# Patient Record
Sex: Male | Born: 1990 | Race: Black or African American | Hispanic: No | Marital: Single | Smoking: Current some day smoker
Health system: Southern US, Community
[De-identification: ages and names within clinical notes are randomized; demographics above are authoritative.]

## PROBLEM LIST (undated history)

## (undated) DIAGNOSIS — F329 Major depressive disorder, single episode, unspecified: Secondary | ICD-10-CM

## (undated) DIAGNOSIS — M1990 Unspecified osteoarthritis, unspecified site: Secondary | ICD-10-CM

## (undated) DIAGNOSIS — K219 Gastro-esophageal reflux disease without esophagitis: Secondary | ICD-10-CM

## (undated) DIAGNOSIS — F4310 Post-traumatic stress disorder, unspecified: Secondary | ICD-10-CM

## (undated) DIAGNOSIS — F32A Depression, unspecified: Secondary | ICD-10-CM

## (undated) DIAGNOSIS — F419 Anxiety disorder, unspecified: Secondary | ICD-10-CM

## (undated) HISTORY — PX: HIP ARTHROSCOPY W/ LABRAL REPAIR: SHX1750

---
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
## 2008-10-25 HISTORY — PX: KNEE ARTHROSCOPY: SUR90

## 2015-08-25 HISTORY — PX: KNEE ARTHROSCOPY: SUR90

## 2018-02-11 ENCOUNTER — Ambulatory Visit: Payer: No Typology Code available for payment source | Admitting: Occupational Therapy

## 2018-02-11 ENCOUNTER — Encounter: Payer: Self-pay | Admitting: Physical Therapy

## 2018-02-11 ENCOUNTER — Ambulatory Visit: Payer: No Typology Code available for payment source | Attending: Orthopedic Surgery | Admitting: Physical Therapy

## 2018-02-11 DIAGNOSIS — M25552 Pain in left hip: Secondary | ICD-10-CM

## 2018-02-11 DIAGNOSIS — M6281 Muscle weakness (generalized): Secondary | ICD-10-CM | POA: Insufficient documentation

## 2018-02-11 DIAGNOSIS — R2689 Other abnormalities of gait and mobility: Secondary | ICD-10-CM | POA: Insufficient documentation

## 2018-02-11 DIAGNOSIS — M25652 Stiffness of left hip, not elsewhere classified: Secondary | ICD-10-CM | POA: Diagnosis present

## 2018-02-11 NOTE — Therapy (Signed)
[facility] [address] [place], [place], [address] Phone: [phone]   Fax:  [phone]  Occupational Therapy Treatment  Patient Details  Name: Luis Arias MRN: [id number] Date of Birth: [birth year] Referring Provider (OT): 12/23/17   Encounter Date: 02/11/2018  OT End of Session - 02/11/18 0906    Visit Number  1    Number of Visits  1    Date for OT Re-Evaluation  --   n/a   Authorization Type  [place]    OT Start Time  0807    OT Stop Time  0840    OT Time Calculation (min)  33 min    Activity Tolerance  Patient tolerated treatment well    Behavior During Therapy  Restless;Anxious       No past medical history on file.  No past surgical history on file.  There were no vitals filed for this visit.  Subjective Assessment - 02/11/18 0813    Subjective   Pt reports pain and weakness s/p surgery to repair a torn hip labrum    Patient Stated Goals  to regain strength and independence    Currently in Pain?  Yes    Pain Score  5     Pain Location  Hip    Pain Orientation  Left    Pain Descriptors / Indicators  Aching    Pain Type  Acute pain    Pain Onset  More than a month ago    Pain Frequency  Intermittent    Aggravating Factors   malpositioning    Pain Relieving Factors  repositioning         [facility] OT Assessment - 02/11/18 0815      Assessment   Medical Diagnosis  left hip labrum surgery 12/23/17   Referring Provider (OT) Stormy Cardobert Creighton MD     Precautions   Precautions  Other (comment)    Precaution Comments  light activity, no quick changes of positions, no squatting      Balance Screen   Has the patient fallen in the past 6 months  No    Has the patient had a decrease in activity level because of a fear of falling?   No    Is the patient reluctant to leave their home because of a fear of falling?   No      Home  Environment   Family/patient expects to be discharged to:  Private  residence    [employer]ower/Tub  Tub/Shower unit    Lives With  Alone      Prior Function   Level of Independence  Independent    Vocation  On disability   from [employer]   Leisure  workout      ADL   Eating/Feeding  Independent    Grooming  Independent    Upper Body Bathing  Modified independent    Lower Body Bathing  Modified independent    Upper Body Dressing  Independent    Lower Body Dressing  Modified independent    Toilet Transfer  Modified independent    Tub/Shower Transfer  Modified independent      IADL   Shopping  Takes care of all shopping needs independently    Light Housekeeping  Maintains house alone or with occasional assistance    Meal Prep  Able to complete simple warm meal prep    Medication Management  Is responsible for taking medication in correct dosages at correct time  Mobility   Mobility Status  Independent      Cognition   Overall Cognitive Status  Within Functional Limits for tasks assessed    Behaviors  Restless      Coordination   Gross Motor Movements are Fluid and Coordinated  Yes      ROM / Strength   AROM / PROM / Strength  AROM;Strength      AROM   Overall AROM   Within functional limits for tasks performed      Strength   Overall Strength  Within functional limits for tasks performed    Overall Strength Comments  Bilateral UE strength 5/5                                   Plan - 02/11/18 0852    Clinical Impression Statement  Pt is a 27 y.o s/p left hip labrum surgery 12/23/17 who presents with the following deficits: hip pain, decreased strength and endurance which impede performance of daily activities. Pt reports he is independent with all basic ADLs and home management and therefore his needs can be addressed through PT. OT will sign off at this time.    Occupational Profile and client history currently impacting functional performance  PMH: knee surgery 2016, hip injury 2017, left meniscus  repair july 2017, left hip labrum repair 12/23/17. Pt is on disability, he was previously in [employer] until 2017    Occupational performance deficits (Please refer to evaluation for details):  ADL's;IADL's;Leisure    Rehab Potential  Good    OT Frequency  One time visit    OT Duration  8 weeks    OT Treatment/Interventions  Self-care/ADL training    Plan  Pt deficits can be addressed through PT, he does not require OT services at this time. No further OT visits needed at this time.,   Clinical Decision Making  Limited treatment options, no task modification necessary    Consulted and Agree with Plan of Care  Patient       Patient will benefit from skilled therapeutic intervention in order to improve the following deficits and impairments:  Pain, Decreased mobility, Decreased endurance, Difficulty walking, Impaired perceived functional ability  Visit Diagnosis: Pain in left hip - Plan: Ot plan of care cert/re-cert    Problem List There are no active problems to display for this patient.   RINE,KATHRYN 02/11/2018, 9:07 AM  [facility] [address] [place], [place], [address] Phone: [phone]   Fax:  [phone]  Name: Luis Arias MRN: [id number] Date of Birth: [birth year]

## 2018-02-11 NOTE — Therapy (Signed)
[facility] [address] [place], [place], [address] Phone: [phone]   Fax:  [phone]  Physical Therapy Evaluation  Patient Details  Name: Luis Arias MRN: [id number] Date of Birth: March 18, 1990 Referring Provider (PT): Stormy Card, MD   Encounter Date: 02/11/2018  PT End of Session - 02/11/18 1201    Visit Number  1    Number of Visits  15    Date for PT Re-Evaluation  05/12/18    Authorization Type  [facility]     Authorization Time Period  11/10/17-08/10/18    Authorization - Visit Number  1    Authorization - Number of Visits  15    PT Start Time  0845    PT Stop Time  0941    PT Time Calculation (min)  56 min    Activity Tolerance  Patient tolerated treatment well    Behavior During Therapy  Anxious;Restless       History reviewed. No pertinent past medical history.  History reviewed. No pertinent surgical history.  There were no vitals filed for this visit.   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  Had hip pain and torn hip labrum on L for several years prior to surgery.  Have had a hard time getting to the surgery and getting to therapy after the surgery.  I've been working on stretching and stregnthening at the gym.    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  To get to the point to where I can participate in sports.    Currently in Pain?  Yes    Pain Score  5     Pain Location  Hip    Pain Orientation  Left    Pain Descriptors / Indicators  Aching    Pain Type  Acute pain    Pain Onset  More than a month ago    Pain Frequency  Intermittent    Aggravating Factors   malpositioning    Pain Relieving Factors  repositioning          [facility] PT Assessment - 02/11/18 0901      Assessment   Medical Diagnosis  left hip labrum tear, s/p arthroscopic surgery    Referring Provider (PT)  Stormy Card, MD    Onset Date/Surgical Date  12/23/17   arthoscopic hip surgery     Precautions   Precautions  Other (comment)    Precaution Comments  light activity, no quick changes of positions, no squatting   per patient report     Balance Screen   Has the patient fallen in the past 6 months  No    Has the patient had a decrease in activity level because of a fear of falling?   No    Is the patient reluctant to leave their home because of a fear of falling?   No      Prior Function   Level of Independence  Independent    Vocation  On disability   from [employer]   Leisure  Enjoys working out and sports      Observation/Other Assessments   Observations  Palpation to L iliotibial band:  pt tender to palpation with trigger point areas noted  along mid-inferior aspect of IT band    Focus on Therapeutic Outcomes (FOTO)   NA      Sensation   Additional Comments  Pt reports numbness along mid-lateral thigh on L      Posture/Postural Control   Posture/Postural Control  Postural limitations    Postural Limitations  Posterior pelvic tilt;Rounded Shoulders   In sitting position; pt able to correct to neutral   Posture Comments  Tends to hold lower extremities in externally rotated hips, L > R      ROM / Strength   AROM / PROM / Strength  AROM;Strength;PROM      AROM   Overall AROM   Deficits    AROM Assessment Site  Hip;Knee    Right/Left Hip  Right;Left    Right Hip Extension  25    Right Hip Flexion  90    Left Hip Extension  12    Left Hip ABduction  --   >30 degrees with pain -tends to externally rotate hip   Right/Left Knee  Right;Left    Right Knee Flexion  125    Left Knee Flexion  110      PROM   PROM Assessment Site  Hip;Knee    Right/Left Hip  Right;Left    Left Hip External Rotation   45   end range  pain   Left Hip Internal Rotation   20    Right/Left Knee  Right;Left    Right Knee Extension  --   lacks 18 degrees full extension   Left Knee Extension  --   lacks 12 degrees full extension     Strength   Overall Strength  Deficits    Strength Assessment Site  Hip;Knee;Ankle    Right/Left Hip  Right;Left    Right Hip Flexion  5/5    Left Hip Flexion  4/5      Ambulation/Gait   Ambulation/Gait  Yes    Ambulation/Gait Assistance  7: Independent    Ambulation Distance (Feet)  100 Feet    Assistive device  None    Gait Pattern  Step-through pattern;Trendelenburg;Lateral hip instability    Ambulation Surface  Level;Indoor    Gait velocity  7.84 sec = 4.18 ft/sec      High Level Balance   High Level Balance Comments  Single limb stance:  Pt able to maintain SLS R and LLE 30 seconds; on LLE, increased trunk sway      MMT continued: R quads, R hamstrings 5/5 L quads 4/5, L hamstrings 4/5 R and L hip extension 3-/5         Objective measurements completed on examination: See above findings.              PT Education - 02/11/18 1200    Education Details  Results of PT evaluation, POC    Person(s) Educated  Patient    Methods  Explanation    Comprehension  Verbalized understanding       PT Short Term Goals - 02/11/18 1211      PT SHORT TERM GOAL #1   Title  Pt will be independent with HEP for improved strength, flexibility, and decreased pain.  TARGET 03/12/18    Time  4    Period  Weeks    Status  New    Target Date  03/12/18      PT SHORT TERM GOAL #2   Title  Pt will demonstrate improved hip internal rotation and hip extension by  5 degrees on L, for improved hip flexibility.    Time  4    Period  Weeks    Status  New    Target Date  03/12/18      PT SHORT TERM GOAL #3   Title  Pt will verbalize/demonstrate proper body mechanics and ADLs in functional activities for decreased pain and improved function.    Time  4    Period  Weeks    Status   New    Target Date  03/12/18        PT Long Term Goals - 02/11/18 1214      PT LONG TERM GOAL #1   Title  Pt will be independent with progressive HEP for improved flexibility, strength, and decreased pain.  TARGET 04/02/18    Time  7    Period  Weeks    Status  New    Target Date  04/02/18      PT LONG TERM GOAL #2   Title  FGA to be assessed, with goal to be written as appropriate to address high level balance and gait activities.    Time  7    Period  Weeks    Status  New    Target Date  04/02/18      PT LONG TERM GOAL #3   Title  Pt will verbalize at least 3 means to decrease pain with functional activities including exercise and gait.    Time  7    Period  Weeks    Status  New    Target Date  04/02/18      PT LONG TERM GOAL #4   Title  Pt will demonstrate improved hip extensor strength to at least 4/5 for decreased Trendelenburg gait pattern, improved stability with gait.    Time  7    Period  Weeks    Status  New    Target Date  04/02/18             Plan - 02/11/18 1202    Clinical Impression Statement  Pt is a 27 year old male who presents to OPPT with history of arthoscopic surgery for L hip labrum tear on 12/23/17.  He presents to OPPT with pain, decreased flexibility, decreased strength,  activity limitiations in return to fitness, sports activities.  Pt is interested in and already participating in exercise activities and appears to have increased flexibility in hip abduction, with limitations in hip adductionstrength and internal rotation motion.  He would beneift from exercises to address muscle imbalance.  He would benefit from skilled PT to address the above stated deficits to decrease pain, improve functional mobility.    History and Personal Factors relevant to plan of care:  PMH includes surgery to repair labrum tear L hip; reported history of R and L meniscal tears post surgery; reported back pain    Clinical Presentation  Stable    Clinical  Presentation due to:  pain, strength, flexibility limitations for functional mobility    Clinical Decision Making  Low    Rehab Potential  Good    PT Frequency  2x / week    PT Duration  Other (comment)   7 weeks plus eval   PT Treatment/Interventions  ADLs/Self Care Home Management;Gait training;Functional mobility training;Therapeutic activities;Therapeutic exercise;Patient/family education;Neuromuscular re-education;Balance training;Manual techniques    PT Next Visit Plan  Check FGA and EO/EC on foam for balance; initiate HEP to address hip adduction/extension strength, low back/core stability, hip internal rotation  stretching    Consulted and Agree with Plan of Care  Patient       Patient will benefit from skilled therapeutic intervention in order to improve the following deficits and impairments:  Abnormal gait, Decreased mobility, Decreased range of motion, Decreased strength, Difficulty walking, Impaired flexibility, Postural dysfunction, Impaired sensation  Visit Diagnosis: Pain in left hip  Muscle weakness (generalized)  Stiffness of left hip, not elsewhere classified  Other abnormalities of gait and mobility     Problem List There are no active problems to display for this patient.   Kathalina Ostermann W. 02/11/2018, 12:18 PM Gean MaidensMARRIOTT,Montserrath Madding W., PT  [facility] [address] [place], [place], [address] Phone: [phone]   Fax:  [phone]  Name: Angelita InglesKennon Shafer MRN: [id number] Date of Birth: Feb 06, 1991

## 2018-02-15 ENCOUNTER — Encounter: Payer: Self-pay | Admitting: Physical Therapy

## 2018-02-15 ENCOUNTER — Ambulatory Visit: Payer: No Typology Code available for payment source | Admitting: Physical Therapy

## 2018-02-15 DIAGNOSIS — M25552 Pain in left hip: Secondary | ICD-10-CM | POA: Diagnosis not present

## 2018-02-15 DIAGNOSIS — M25652 Stiffness of left hip, not elsewhere classified: Secondary | ICD-10-CM

## 2018-02-15 DIAGNOSIS — M6281 Muscle weakness (generalized): Secondary | ICD-10-CM

## 2018-02-15 NOTE — Patient Instructions (Signed)
MedBridge created and printed (did not get code)  -bilateral bridging with ball squeezes -single limb bridging -hip flexor stretch, off edge of mat, 3 reps 30 seconds -L hip abduction x 10 sidelying -L hip extension x 10 prone -L hip adduction x 10 sidelying  To be performed 2 sets x 10, 5x per week

## 2018-02-15 NOTE — Therapy (Signed)
[facility] [address] [place], [place], [address] Phone: [phone]   Fax:  [phone]  Physical Therapy Treatment  Patient Details  Name: Luis InglesKennon Arias MRN: [id number] Date of Birth: [birth year] Referring Provider (PT): Stormy Cardobert Creighton, MD   Encounter Date: 02/15/2018  PT End of Session - 02/15/18 1512    Visit Number  2    Number of Visits  15    Date for PT Re-Evaluation  05/12/18    Authorization Type  [facility]     Authorization Time Period  11/10/17-08/10/18    Authorization - Visit Number  2    Authorization - Number of Visits  15    PT Start Time  0936    PT Stop Time  1018    PT Time Calculation (min)  42 min    Equipment Utilized During Treatment  Gait belt    Activity Tolerance  Patient tolerated treatment well    Behavior During Therapy  [facility] for tasks assessed/performed       History reviewed. No pertinent past medical history.  History reviewed. No pertinent surgical history.  There were no vitals filed for this visit.  Subjective Assessment - 02/15/18 0937    Subjective  Having still a little numbness and pain as normal.  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  To get to the point to where I can participate in sports.    Currently in Pain?  Yes    Pain Score  5     Pain Location  Hip    Pain Orientation  Left    Pain Descriptors / Indicators  Aching    Pain Onset  More than a month ago    Pain Frequency  Intermittent    Aggravating Factors   malpositioning    Pain Relieving Factors  repositioning                       [facility] Adult PT Treatment/Exercise - 02/15/18 0947      Exercises   Exercises  Knee/Hip;Other Exercises    Other  Exercises   Seated lumbar stabilization-anterior/posterior pelvic tilt with cues for abdominal activation in neutral.      Knee/Hip Exercises: Stretches   Hip Flexor Stretch  Left;1 rep;30 seconds   supine off edge of the mat   Other Knee/Hip Stretches  knee to chest stretch, 2 reps LLE x 10 seconds, RLE x 10 seconds 1 rep      Knee/Hip Exercises: Aerobic   Stationary Bike  foot pedaler x 5 minutes, forward, attention to neutral knee positioning      Knee/Hip Exercises: Standing   Wall Squat  1 set;10 reps    Wall Squat Limitations  Cues for abdominal activation       Knee/Hip Exercises: Supine   Bridges  Strengthening;Both;1 set;10 reps    Bridges with Harley-DavidsonBall Squeeze  Strengthening;Both;1 set;10 reps    Single Leg Bridge  Strengthening;Left;1 set;10 reps;Right      Knee/Hip Exercises: Sidelying   Hip ABduction  Strengthening;Left;1 set;10 reps    Hip ABduction Limitations  Cues for positioning in neutral with hip position    Hip ADduction  Left;1 set;10 reps  Knee/Hip Exercises: Prone   Hip Extension  Strengthening;Left;1 set;10 reps             PT Education - 02/15/18 1511    Education Details  HEP, attention to neutral spine and neutral hip positioning    Person(s) Educated  Patient    Methods  Explanation;Demonstration;Handout    Comprehension  Verbalized understanding;Returned demonstration;Verbal cues required       PT Short Term Goals - 02/11/18 1211      PT SHORT TERM GOAL #1   Title  Pt will be independent with HEP for improved strength, flexibility, and decreased pain.  TARGET 03/12/18    Time  4    Period  Weeks    Status  New    Target Date  03/12/18      PT SHORT TERM GOAL #2   Title  Pt will demonstrate improved hip internal rotation and hip extension by 5 degrees on L, for improved hip flexibility.    Time  4    Period  Weeks    Status  New    Target Date  03/12/18      PT SHORT TERM GOAL #3   Title  Pt will verbalize/demonstrate proper body  mechanics and ADLs in functional activities for decreased pain and improved function.    Time  4    Period  Weeks    Status  New    Target Date  03/12/18        PT Long Term Goals - 02/11/18 1214      PT LONG TERM GOAL #1   Title  Pt will be independent with progressive HEP for improved flexibility, strength, and decreased pain.  TARGET 04/02/18    Time  7    Period  Weeks    Status  New    Target Date  04/02/18      PT LONG TERM GOAL #2   Title  FGA to be assessed, with goal to be written as appropriate to address high level balance and gait activities.    Time  7    Period  Weeks    Status  New    Target Date  04/02/18      PT LONG TERM GOAL #3   Title  Pt will verbalize at least 3 means to decrease pain with functional activities including exercise and gait.    Time  7    Period  Weeks    Status  New    Target Date  04/02/18      PT LONG TERM GOAL #4   Title  Pt will demonstrate improved hip extensor strength to at least 4/5 for decreased Trendelenburg gait pattern, improved stability with gait.    Time  7    Period  Weeks    Status  New    Target Date  04/02/18            Plan - 02/15/18 1513    Clinical Impression Statement  HEP more formally initiated this visit, to address hip stretching and hip strengthening, particularly of hip extensors and hip adductors.  Pt is motivated to continue therapy for strength and flexibility; at end of session, he rates lower pain to 4/10.  He will continue to benefit from skilled PT to      Rehab Potential  Good    PT Frequency  2x / week    PT Duration  Other (comment)   7 weeks plus eval   PT  Treatment/Interventions  ADLs/Self Care Home Management;Gait training;Functional mobility training;Therapeutic activities;Therapeutic exercise;Patient/family education;Neuromuscular re-education;Balance training;Manual techniques    PT Next Visit Plan  Check FGA and EO/EC on foam for balance; review HEP to address hip  adduction/extension strength, low back/core stability, hip internal rotation stretching    Consulted and Agree with Plan of Care  Patient       Patient will benefit from skilled therapeutic intervention in order to improve the following deficits and impairments:  Abnormal gait, Decreased mobility, Decreased range of motion, Decreased strength, Difficulty walking, Impaired flexibility, Postural dysfunction, Impaired sensation  Visit Diagnosis: Muscle weakness (generalized)  Stiffness of left hip, not elsewhere classified  Pain in left hip     Problem List There are no active problems to display for this patient.   Anouk Critzer W. 02/15/2018, 3:19 PM  Gean Maidens., PT   [facility] [address] [place], [place], [address] Phone: [phone]   Fax:  [phone]  Name: Luis Arias MRN: [id number] Date of Birth: Dec 13, 1990

## 2018-02-22 ENCOUNTER — Encounter: Payer: Self-pay | Admitting: Rehabilitation

## 2018-02-22 ENCOUNTER — Ambulatory Visit: Payer: No Typology Code available for payment source | Admitting: Rehabilitation

## 2018-02-22 DIAGNOSIS — M25652 Stiffness of left hip, not elsewhere classified: Secondary | ICD-10-CM

## 2018-02-22 DIAGNOSIS — M25552 Pain in left hip: Secondary | ICD-10-CM

## 2018-02-22 DIAGNOSIS — M6281 Muscle weakness (generalized): Secondary | ICD-10-CM

## 2018-02-22 DIAGNOSIS — R2689 Other abnormalities of gait and mobility: Secondary | ICD-10-CM

## 2018-02-22 NOTE — Patient Instructions (Signed)
Access Code: [email or web address]  URL: [email or web address]  Date: 02/22/2018  Prepared by: Harriet ButteEmily Tynika Luddy   Exercises  Supine ITB Stretch with Strap - 3 reps - 1 sets - 30-45 hold - 2-3x daily - 7x weekly  Romberg Stance Eyes Closed on Foam Pad - 10 reps - 1 sets - 1x daily - 7x weekly  Single Leg Stance on Foam Pad - 3 reps - 1 sets - 10-15 hold - 1x daily - 7x weekly  Supine Butterfly Groin Stretch - 3 reps - 1 sets - 30-45 hold - 2-3x daily - 7x weekly

## 2018-02-22 NOTE — Therapy (Signed)
[facility] [address] [place], [place], [address] Phone: [phone]   Fax:  [phone]  Physical Therapy Treatment  Patient Details  Name: Luis Arias MRN: [id number] Date of Birth: 1990/11/19 Referring Provider (PT): Stormy Card, MD   Encounter Date: 02/22/2018  PT End of Session - 02/22/18 1408    Visit Number  3    Number of Visits  15    Date for PT Re-Evaluation  05/12/18    Authorization Type  [facility]     Authorization Time Period  11/10/17-08/10/18    Authorization - Visit Number  3    Authorization - Number of Visits  15    PT Start Time  1153   pt late to session   PT Stop Time  1230    PT Time Calculation (min)  37 min    Equipment Utilized During Treatment  Gait belt    Activity Tolerance  Patient tolerated treatment well    Behavior During Therapy  [facility] for tasks assessed/performed       History reviewed. No pertinent past medical history.  History reviewed. No pertinent surgical history.  There were no vitals filed for this visit.  Subjective Assessment - 02/22/18 1153    Subjective  Pt reports still having about the same amount of pain.  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  To get to the point to where I can participate in sports.    Currently in Pain?  Yes    Pain Score  5     Pain Location  Hip    Pain Orientation  Left    Pain Descriptors / Indicators  Aching;Numbness    Pain Type  Acute pain    Pain Onset  More than a month ago    Pain Frequency  Intermittent    Aggravating Factors   malpositioning    Pain Relieving Factors  repositioniong         [facility] PT Assessment - 02/22/18 1208      Functional Gait  Assessment   Gait assessed   Yes     Gait Level Surface  Walks 20 ft in less than 5.5 sec, no assistive devices, good speed, no evidence for imbalance, normal gait pattern, deviates no more than 6 in outside of the 12 in walkway width.    Change in Gait Speed  Able to smoothly change walking speed without loss of balance or gait deviation. Deviate no more than 6 in outside of the 12 in walkway width.    Gait with Horizontal Head Turns  Performs head turns smoothly with no change in gait. Deviates no more than 6 in outside 12 in walkway width    Gait with Vertical Head Turns  Performs head turns with no change in gait. Deviates no more than 6 in outside 12 in walkway width.    Gait and Pivot Turn  Pivot turns safely within 3 sec and stops quickly with no loss of balance.    Step Over Obstacle  Is able to step over 2 stacked shoe boxes taped together (9 in total height) without changing gait speed. No evidence of imbalance.    Gait with Narrow Base of Support  Is able to ambulate for 10 steps heel to toe with no staggering.    Gait with Eyes Closed  Walks 20 ft, uses assistive device, slower speed, mild gait deviations, deviates 6-10 in outside 12 in walkway width. Ambulates 20 ft in less than 9 sec but greater than 7 sec.    Ambulating Backwards  Walks 20 ft, uses assistive device, slower speed, mild gait deviations, deviates 6-10 in outside 12 in walkway width.    Steps  Alternating feet, no rail.    Total Score  28                   [facility] Adult PT Treatment/Exercise - 02/22/18 1208      High Level Balance   High Level Balance Comments  Standing on foam airex in corner performing L SLS x 2 sets of 20 secs with cues for improved L hip stabilization, feet together EC x 2 sets of 20 secs, feet together EC performing head turns x 10 reps and head nods x 10 reps.  Added last to HEP.        Exercises   Exercises  Other Exercises    Other Exercises   Supine hip flexor stretch as in HEP prior to FGA assessment x 2 min on  each side.  Supine L ITB stretch with strap x 2 sets of 45 secs-added to HEP, Performed plank on elbows x 15 secs then attempting hip flex/abd/ER (fire hydrant) in plank, however this caused increased pain.  Pt able to do with lesser ROM, however due to weaker core, began to arch low back, therfore had pt perform in quadruped x 10 reps with marked improvement in technique and pain.   Supine single leg butterfly stretch to LLE with cues for technique and not going to point of pain x 2 reps of 30 secs.             PT Education - 02/22/18 1408    Education Details  additions to HEP for LE flexibility/balance     Person(s) Educated  Patient    Methods  Explanation;Demonstration;Handout    Comprehension  Verbalized understanding;Returned demonstration       PT Short Term Goals - 02/11/18 1211      PT SHORT TERM GOAL #1   Title  Pt will be independent with HEP for improved strength, flexibility, and decreased pain.  TARGET 03/12/18    Time  4    Period  Weeks    Status  New    Target Date  03/12/18      PT SHORT TERM GOAL #2   Title  Pt will demonstrate improved hip internal rotation and hip extension by 5 degrees on L, for improved hip flexibility.    Time  4    Period  Weeks    Status  New    Target Date  03/12/18      PT SHORT TERM GOAL #3   Title  Pt will verbalize/demonstrate proper body mechanics and ADLs in functional activities for decreased pain and improved function.    Time  4    Period  Weeks    Status  New    Target Date  03/12/18        PT Long Term Goals - 02/11/18 1214      PT LONG TERM GOAL #1   Title  Pt will be independent with progressive HEP for improved flexibility, strength, and decreased pain.  TARGET 04/02/18  Time  7    Period  Weeks    Status  New    Target Date  04/02/18      PT LONG TERM GOAL #2   Title  FGA to be assessed, with goal to be written as appropriate to address high level balance and gait activities.    Time  7    Period  Weeks     Status  New    Target Date  04/02/18      PT LONG TERM GOAL #3   Title  Pt will verbalize at least 3 means to decrease pain with functional activities including exercise and gait.    Time  7    Period  Weeks    Status  New    Target Date  04/02/18      PT LONG TERM GOAL #4   Title  Pt will demonstrate improved hip extensor strength to at least 4/5 for decreased Trendelenburg gait pattern, improved stability with gait.    Time  7    Period  Weeks    Status  New    Target Date  04/02/18            Plan - 02/22/18 1409    Clinical Impression Statement  Pt continues to be very motivated to progress with therapy.  Focused on assessment of FGA with score of 28/30, however did have some instability and postural sway on compliant surfaces with EC and feet together.  Added to HEP as well as ITB stretch.      Rehab Potential  Good    PT Frequency  2x / week    PT Duration  Other (comment)   7 weeks plus eval   PT Treatment/Interventions  ADLs/Self Care Home Management;Gait training;Functional mobility training;Therapeutic activities;Therapeutic exercise;Patient/family education;Neuromuscular re-education;Balance training;Manual techniques    PT Next Visit Plan  review HEP to address hip adduction/extension strength, low back/core stability, hip internal rotation stretching, L SLS, compliant surfaces    Consulted and Agree with Plan of Care  Patient       Patient will benefit from skilled therapeutic intervention in order to improve the following deficits and impairments:  Abnormal gait, Decreased mobility, Decreased range of motion, Decreased strength, Difficulty walking, Impaired flexibility, Postural dysfunction, Impaired sensation  Visit Diagnosis: Muscle weakness (generalized)  Stiffness of left hip, not elsewhere classified  Pain in left hip  Other abnormalities of gait and mobility     Problem List There are no active problems to display for this patient.   Harriet ButteEmily  Timara Loma, PT, MPT [facility] [address] [place], [place], [address] Phone: [phone]   Fax:  [phone] 02/22/18, 2:14 PM  Name: Luis Arias MRN: [id number] Date of Birth: 04-11-1990

## 2018-02-26 ENCOUNTER — Encounter: Payer: Self-pay | Admitting: Physical Therapy

## 2018-02-26 ENCOUNTER — Ambulatory Visit: Payer: No Typology Code available for payment source | Attending: Orthopedic Surgery | Admitting: Physical Therapy

## 2018-02-26 DIAGNOSIS — M25652 Stiffness of left hip, not elsewhere classified: Secondary | ICD-10-CM | POA: Diagnosis present

## 2018-02-26 DIAGNOSIS — R2689 Other abnormalities of gait and mobility: Secondary | ICD-10-CM | POA: Insufficient documentation

## 2018-02-26 DIAGNOSIS — M25552 Pain in left hip: Secondary | ICD-10-CM | POA: Insufficient documentation

## 2018-02-26 DIAGNOSIS — M6281 Muscle weakness (generalized): Secondary | ICD-10-CM | POA: Diagnosis present

## 2018-02-26 NOTE — Patient Instructions (Signed)
Access Code: [email or web address]  URL: [email or web address]  Date: 02/26/2018  Prepared by: Lonia Blood   Exercises  Supine ITB Stretch with Strap - 3 reps - 1 sets - 30-45 hold - 2-3x daily - 7x weekly  Romberg Stance Eyes Closed on Foam Pad - 10 reps - 1 sets - 1x daily - 7x weekly  Single Leg Stance on Foam Pad - 3 reps - 1 sets - 10-15 hold - 1x daily - 7x weekly  Supine Butterfly Groin Stretch - 3 reps - 1 sets - 30-45 hold - 2-3x daily - 7x weekly   Added 02/26/2018 Standing ITB Stretch - 3 reps - 1 sets - 15-30 sec hold - 1x daily - 7x weekly  Supine Figure 4 Piriformis Stretch - 3 reps - 1 sets - 15-30 sec hold - 1x daily - 7x weekly  Standing Hip Flexor Stretch - 3 reps - 1 sets - 15-30 sec hold - 1x daily - 7x weekly

## 2018-02-26 NOTE — Therapy (Signed)
[facility] [address] [place], [place], [address] Phone: [phone]   Fax:  [phone]  Physical Therapy Treatment  Patient Details  Name: Luis Arias MRN: [id number] Date of Birth: 02-02-1991 Referring Provider (PT): Stormy Card, MD   Encounter Date: 02/26/2018  PT End of Session - 02/26/18 1311    Visit Number  4    Number of Visits  15    Date for PT Re-Evaluation  05/12/18    Authorization Type  [facility]     Authorization Time Period  11/10/17-08/10/18    Authorization - Visit Number  4    Authorization - Number of Visits  15    PT Start Time  1018    PT Stop Time  1107    PT Time Calculation (min)  49 min    Equipment Utilized During Treatment  Gait belt    Activity Tolerance  Patient tolerated treatment well;Patient limited by pain   C/o pain increased to 6/10 at end of session   Behavior During Therapy  [facility] for tasks assessed/performed;Impulsive   Tends to go into excess ROM with hip abduction/external rotation, hip flexion; repeated cues for correct technique      History reviewed. No pertinent past medical history.  History reviewed. No pertinent surgical history.  There were no vitals filed for this visit.  Subjective Assessment - 02/26/18 1020    Subjective  Been doing my exercises; it's a challenge, but I've been wroking on it.    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  To get to the point to where I can participate in sports.    Currently in Pain?  Yes    Pain Score  5     Pain Location  Hip    Pain Orientation  Left    Pain Descriptors / Indicators  Aching    Pain Onset  More than a month ago    Pain Frequency  Intermittent    Aggravating Factors   pain hasn't really changed; certain movements     Pain  Relieving Factors  repositioning, good form with exercises                       [facility] Adult PT Treatment/Exercise - 02/26/18 0001      Exercises   Exercises  Other Exercises    Other Exercises   Quadruped core stability exercises with instructions for patient to find neutral backposture for abdominal setting, and to relax through shoulders, as he has shoulders elevated and scapular winging noted; hip extension 2 sets x 5 reps RLE and LLE.      Knee/Hip Exercises: Stretches   Hip Flexor Stretch  Left;3 reps;30 seconds   Standing at wall   ITB Stretch  Left;20 seconds;3 reps   Standing at wall   ITB Stretch Limitations  Cues for correct technique to feel adequate IT band stretch, to avoid twisting through upper body and to avoid should hike with UE at wall    Piriformis Stretch  Left;3 reps;30 seconds    Piriformis Stretch Limitations  Cues for correct technique, to avoid painful stretch    Other Knee/Hip Stretches  Reviewed supine IT band stretch from last visit; however, pt brings LLE to 90 degrees, then diagonally over with belt to  stretch, c/o pain in groin area.  Educated patient that he does not need to bring leg up so far and stretch should be felt on lateral aspect of L thigh (along IT band); performed a second time with less leg raise then diagonal pull across body, less c/o pain in L groin      Knee/Hip Exercises: Standing   Wall Squat  1 set;10 reps    Wall Squat Limitations  Cues for adominal activiation, to avoid going to low into squat position. 2nd set x 8 reps with pillow squeeze for adductor activaiton.      Knee/Hip Exercises: Seated   Other Seated Knee/Hip Exercises  Resisted hip internal rotation 2 sets x 10 reps with green band, then seated resisted external rotation with green band      Knee/Hip Exercises: Supine   Other Supine Knee/Hip Exercises  Hip internal rotation 15 reps 3 second hold   Cues for correct technique            PT Education  - 02/26/18 1310    Education Details  Additions to HEP for additional flexibility, cues to relax and perform exercises correctly, cues to avoid excessive L hip external rotation and flexion and focus on more neutral positioning    Person(s) Educated  Patient    Methods  Explanation;Demonstration;Handout;Verbal cues    Comprehension  Verbalized understanding;Returned demonstration;Verbal cues required       PT Short Term Goals - 02/11/18 1211      PT SHORT TERM GOAL #1   Title  Pt will be independent with HEP for improved strength, flexibility, and decreased pain.  TARGET 03/12/18    Time  4    Period  Weeks    Status  New    Target Date  03/12/18      PT SHORT TERM GOAL #2   Title  Pt will demonstrate improved hip internal rotation and hip extension by 5 degrees on L, for improved hip flexibility.    Time  4    Period  Weeks    Status  New    Target Date  03/12/18      PT SHORT TERM GOAL #3   Title  Pt will verbalize/demonstrate proper body mechanics and ADLs in functional activities for decreased pain and improved function.    Time  4    Period  Weeks    Status  New    Target Date  03/12/18        PT Long Term Goals - 02/11/18 1214      PT LONG TERM GOAL #1   Title  Pt will be independent with progressive HEP for improved flexibility, strength, and decreased pain.  TARGET 04/02/18    Time  7    Period  Weeks    Status  New    Target Date  04/02/18      PT LONG TERM GOAL #2   Title  FGA to be assessed, with goal to be written as appropriate to address high level balance and gait activities.    Time  7    Period  Weeks    Status  New    Target Date  04/02/18      PT LONG TERM GOAL #3   Title  Pt will verbalize at least 3 means to decrease pain with functional activities including exercise and gait.    Time  7    Period  Weeks    Status  New  Target Date  04/02/18      PT LONG TERM GOAL #4   Title  Pt will demonstrate improved hip extensor strength to at least  4/5 for decreased Trendelenburg gait pattern, improved stability with gait.    Time  7    Period  Weeks    Status  New    Target Date  04/02/18            Plan - 02/26/18 1313    Clinical Impression Statement  Skilled PT session focused mostly today again on flexibility (additions including piriformis stretch and additional IT band stretch). Pt continues to c/o pain 5-6/10 in L hip, groin area; however, pt continues to need cues for correct technique.  He tends to repeat motions that are excess ROM, especially in L hip flexion, external rotation and abduction.  In addition, he guards whole body musculature while performing hip internal rotation, hip adduction strengthening and core stability exercises.  He will continue to benefit from skilled PT to address strength, flexibility, dynamic balance activities.    Rehab Potential  Good    PT Frequency  2x / week    PT Duration  Other (comment)   7 weeks plus eval   PT Treatment/Interventions  ADLs/Self Care Home Management;Gait training;Functional mobility training;Therapeutic activities;Therapeutic exercise;Patient/family education;Neuromuscular re-education;Balance training;Manual techniques    PT Next Visit Plan  review updates to HEP; HEP to address hip adduction/extension strength, low back/core stability, hip internal rotation stretching, L SLS, compliant surfaces    PT Home Exercise Plan  [id number] and additional MedBridge HEP in 02/26/2018 instructions    Consulted and Agree with Plan of Care  Patient       Patient will benefit from skilled therapeutic intervention in order to improve the following deficits and impairments:  Abnormal gait, Decreased mobility, Decreased range of motion, Decreased strength, Difficulty walking, Impaired flexibility, Postural dysfunction, Impaired sensation  Visit Diagnosis: Muscle weakness (generalized)  Stiffness of left hip, not elsewhere classified     Problem List There are no active problems  to display for this patient.   Gean Maidens 02/26/2018, 1:21 PM  Gean Maidens PT   [facility] [address] [place], [place], [address] Phone: [phone]   Fax:  [phone]  Name: Tyion Ghobrial MRN: [id number] Date of Birth: June 09, 1990

## 2018-03-02 ENCOUNTER — Ambulatory Visit: Payer: No Typology Code available for payment source | Admitting: Physical Therapy

## 2018-03-02 DIAGNOSIS — R2689 Other abnormalities of gait and mobility: Secondary | ICD-10-CM

## 2018-03-02 DIAGNOSIS — M6281 Muscle weakness (generalized): Secondary | ICD-10-CM | POA: Diagnosis not present

## 2018-03-02 DIAGNOSIS — M25652 Stiffness of left hip, not elsewhere classified: Secondary | ICD-10-CM

## 2018-03-02 DIAGNOSIS — M25552 Pain in left hip: Secondary | ICD-10-CM

## 2018-03-02 NOTE — Therapy (Signed)
[facility] [address] [place], [place], [address] Phone: [phone]   Fax:  [phone]  Physical Therapy Treatment  Patient Details  Name: Luis Arias Referring Provider (PT): Stormy Card, MD   Encounter Date: 03/02/2018  PT End of Session - 03/02/18 1310    Visit Number  5    Number of Visits  15    Date for PT Re-Evaluation  05/12/18    Authorization Type  [facility]     Authorization Time Period  11/10/17-08/10/18    Authorization - Visit Number  5    Authorization - Number of Visits  15    PT Start Time  1100    PT Stop Time  1147    PT Time Calculation (min)  47 min    Activity Tolerance  Patient tolerated treatment well    Behavior During Therapy  [facility] for tasks assessed/performed;Impulsive       No past medical history on file.  No past surgical history on file.  There were no vitals filed for this visit.  Subjective Assessment - 03/02/18 1206    Subjective  Pt relays he has good days and bad days, sometimes he can do more other times he is limited in his Lt hip    Currently in Pain?  Yes    Pain Score  5     Pain Location  Hip    Pain Orientation  Left    Pain Descriptors / Indicators  Aching    Pain Type  Acute pain    Pain Onset  More than a month ago    Pain Frequency  Intermittent    Aggravating Factors   flexing hip to high or crossing his leg sometimes                       [facility] Adult PT Treatment/Exercise - 03/02/18 0001      Exercises   Exercises  Knee/Hip      Knee/Hip Exercises: Stretches   Hip Flexor Stretch  Left;2 reps;30 seconds    Hip Flexor Stretch Limitations  supine Lt leg off mat with strap    ITB Stretch  Left;2 reps;30 seconds    Piriformis Stretch  Left;2 reps;20 seconds      Knee/Hip Exercises: Aerobic   Nustep  L9, 5 min LE       Knee/Hip Exercises: Standing   Other Standing Knee Exercises  SL deadlift  no wt on Lt, and hip hike off step for Lt 2X10 each, sidestepping with squat with green Tband around knees, sit to stand with Rt leg further in front to facillitate inc wt shift onto Lt leg, 2X 10      Knee/Hip Exercises: Seated   Other Seated Knee/Hip Exercises  Resisted hip internal rotation and external rotation 2 sets x 10 reps with green band, then seated resisted external rotation with green band      Knee/Hip Exercises: Supine   Bridges with Ball Squeeze  10 reps   with isometric knee extension   Bridges with Clamshell  10 reps   green Tband     Manual Therapy   Manual therapy comments  long axis distraction hip mobs                PT Short Term Goals - 02/11/18 1211      PT SHORT TERM GOAL #1   Title  Pt will be  independent with HEP for improved strength, flexibility, and decreased pain.  TARGET 03/12/18    Time  4    Period  Weeks    Status  New    Target Date  03/12/18      PT SHORT TERM GOAL #2   Title  Pt will demonstrate improved hip internal rotation and hip extension by 5 degrees on L, for improved hip flexibility.    Time  4    Period  Weeks    Status  New    Target Date  03/12/18      PT SHORT TERM GOAL #3   Title  Pt will verbalize/demonstrate proper body mechanics and ADLs in functional activities for decreased pain and improved function.    Time  4    Period  Weeks    Status  New    Target Date  03/12/18        PT Long Term Goals - 02/11/18 1214      PT LONG TERM GOAL #1   Title  Pt will be independent with progressive HEP for improved flexibility, strength, and decreased pain.  TARGET 04/02/18    Time  7    Period  Weeks    Status  New    Target Date  04/02/18      PT LONG TERM GOAL #2   Title  FGA to be assessed, with goal to be written as appropriate to address high level balance and gait activities.    Time  7    Period  Weeks    Status  New    Target Date  04/02/18      PT LONG TERM GOAL #3   Title  Pt will verbalize at least 3  means to decrease pain with functional activities including exercise and gait.    Time  7    Period  Weeks    Status  New    Target Date  04/02/18      PT LONG TERM GOAL #4   Title  Pt will demonstrate improved hip extensor strength to at least 4/5 for decreased Trendelenburg gait pattern, improved stability with gait.    Time  7    Period  Weeks    Status  New    Target Date  04/02/18            Plan - 03/02/18 1458    Clinical Impression Statement  Session focused on hip strength, stability, and stretching today with exercise progression into SL activities including hip hike on step, and SL deadlift/forward reaches to floor with good tolerance. He does still complain of intermittent pain deep in his groin if hip is flexed too far with adduction at times. He may be having some impingment but difficut to tell. No clunking noted with any special testing today. He wil continue to benefit from skilled PT to further increase his hip strength, stability, and endurance.     Rehab Potential  Good    PT Frequency  2x / week    PT Duration  Other (comment)    PT Treatment/Interventions  ADLs/Self Care Home Management;Gait training;Functional mobility training;Therapeutic activities;Therapeutic exercise;Patient/family education;Neuromuscular re-education;Balance training;Manual techniques    PT Next Visit Plan  hip adduction/extension strength, low back/core stability, hip internal rotation stretching, L SLS activites, compliant surfaces, functional strengthening    PT Home Exercise Plan  [id number] and additional MedBridge HEP in 02/26/2018 instructions    Consulted and Agree with Plan of Care  Patient  Patient will benefit from skilled therapeutic intervention in order to improve the following deficits and impairments:  Abnormal gait, Decreased mobility, Decreased range of motion, Decreased strength, Difficulty walking, Impaired flexibility, Postural dysfunction, Impaired sensation  Visit  Diagnosis: Muscle weakness (generalized)  Stiffness of left hip, not elsewhere classified  Pain in left hip  Other abnormalities of gait and mobility     Problem List There are no active problems to display for this patient.   Birdie Riddle 03/02/2018, 3:05 PM  [facility] [address] [place], [place], [address] Phone: [phone]   Fax:  [phone]  Name: Luis Arias, Luis Arias

## 2018-03-05 ENCOUNTER — Ambulatory Visit: Payer: No Typology Code available for payment source | Admitting: Physical Therapy

## 2018-03-05 DIAGNOSIS — M6281 Muscle weakness (generalized): Secondary | ICD-10-CM

## 2018-03-05 DIAGNOSIS — R2689 Other abnormalities of gait and mobility: Secondary | ICD-10-CM

## 2018-03-05 NOTE — Therapy (Signed)
[facility] [address] [place], [place], [address] Phone: [phone]   Fax:  [phone]  Physical Therapy Treatment  Patient Details  Name: Luis Arias MRN: [id number] Date of Birth: 1990/08/30 Referring Provider (PT): Stormy Cardobert Creighton, MD   Encounter Date: 03/05/2018  PT End of Session - 03/05/18 1626    Visit Number  6    Number of Visits  15    Date for PT Re-Evaluation  05/12/18    Authorization Type  [facility]     Authorization Time Period  11/10/17-08/10/18    Authorization - Visit Number  6    Authorization - Number of Visits  15    PT Start Time  1020    PT Stop Time  1100    PT Time Calculation (min)  40 min    Activity Tolerance  Patient tolerated treatment well    Behavior During Therapy  [facility] for [facility] assessed/performed       No past medical history on file.  No past surgical history on file.  There were no vitals filed for this visit.  Subjective Assessment - 03/05/18 1022    Subjective  Feel like even when I relax that then I feel a popping in my fibula/tibia area.    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  To get to the point to where I can participate in sports.    Currently in Pain?  Yes    Pain Score  5     Pain Location  Hip   lateral leg   Pain Orientation  Left    Pain Descriptors / Indicators  Aching;Sharp   sometimes sharp   Pain Type  Acute pain    Pain Onset  More than a month ago    Pain Frequency  Intermittent    Aggravating Factors   flexing hip to thigh or crossing his leg sometimes    Pain Relieving Factors  repositioning, good form with exercises                       [facility] Adult PT Treatment/Exercise - 03/05/18 1031      Knee/Hip Exercises: Stretches   Hip Flexor Stretch   Right;Left;3 reps;30 seconds    Hip Flexor Stretch Limitations  Standing, following SciFit      Knee/Hip Exercises: Aerobic   Stepper  SciFit Stepper, Level 3, lower extremities only, x 6 minutes for strengthening; cues for neutral knee positioning   No c/o pain after SciFit     Knee/Hip Exercises: Standing   Other Standing Knee Exercises  SLS LLE with R forward step taps to 6" step>into L hip extensor position, 2 sets x 10 reps.  SLS LLE on step to work on hip/hike/stability in SLS (hip elevation/depression) 2 sets x 10 reps    Other Standing Knee Exercises  Resisted sidestepping with blue theraband for hip abductor strengthening (cues for neutral foot/knee positioning and cues for "relaxed" knee positioning on LLE to push off; then progress to resisted sidestep squats, 10 reps R and L directions each exercise; progressed to forward march and hold for SLS activity 10 reps, 2 sets, then forward lunge, 20 ft 2 reps       Ended PT session with cues for abdominal activation for improved LLE  SLS during stance phase of gait and with these cues x 460 ft, more relaxed gait and decreased Trendelenburg gait pattern noted.        PT Short Term Goals - 02/11/18 1211      PT SHORT TERM GOAL #1   Title  Pt will be independent with HEP for improved strength, flexibility, and decreased pain.  TARGET 03/12/18    Time  4    Period  Weeks    Status  New    Target Date  03/12/18      PT SHORT TERM GOAL #2   Title  Pt will demonstrate improved hip internal rotation and hip extension by 5 degrees on L, for improved hip flexibility.    Time  4    Period  Weeks    Status  New    Target Date  03/12/18      PT SHORT TERM GOAL #3   Title  Pt will verbalize/demonstrate proper body mechanics and ADLs in functional activities for decreased pain and improved function.    Time  4    Period  Weeks    Status  New    Target Date  03/12/18        PT Long Term Goals - 02/11/18 1214      PT LONG TERM GOAL  #1   Title  Pt will be independent with progressive HEP for improved flexibility, strength, and decreased pain.  TARGET 04/02/18    Time  7    Period  Weeks    Status  New    Target Date  04/02/18      PT LONG TERM GOAL #2   Title  FGA to be assessed, with goal to be written as appropriate to address high level balance and gait activities.    Time  7    Period  Weeks    Status  New    Target Date  04/02/18      PT LONG TERM GOAL #3   Title  Pt will verbalize at least 3 means to decrease pain with functional activities including exercise and gait.    Time  7    Period  Weeks    Status  New    Target Date  04/02/18      PT LONG TERM GOAL #4   Title  Pt will demonstrate improved hip extensor strength to at least 4/5 for decreased Trendelenburg gait pattern, improved stability with gait.    Time  7    Period  Weeks    Status  New    Target Date  04/02/18            Plan - 03/05/18 1627    Clinical Impression Statement  At beginning of session, pt reports continueing to have pain; with session focusing on mostly strengthening exercises and neutral L hip/knee positioning (avoiding excessive external rotation with flexion activities), he reports no increase in pain at session's end.  Today's pain is more reported on lateral aspect posterior to L knee near fibular head.  Encouraged pt to speak with MD at his visit next week regarding ongoing pain concerns.  Pt tolerates strengthening activities well today.    Rehab Potential  Good    PT Frequency  2x / week    PT Duration  Other (comment)    PT Treatment/Interventions  ADLs/Self Care Home Management;Gait training;Functional mobility training;Therapeutic activities;Therapeutic exercise;Patient/family education;Neuromuscular re-education;Balance training;Manual techniques    PT Next Visit Plan  hip  adduction/extension strength, low back/core stability, hip internal rotation stretching, L SLS activites, compliant surfaces, functional  strengthening    PT Home Exercise Plan  [id number] and additional MedBridge HEP in 02/26/2018 instructions    Consulted and Agree with Plan of Care  Patient     CHECK STGs week of 03/08/2018  Patient will benefit from skilled therapeutic intervention in order to improve the following deficits and impairments:  Abnormal gait, Decreased mobility, Decreased range of motion, Decreased strength, Difficulty walking, Impaired flexibility, Postural dysfunction, Impaired sensation  Visit Diagnosis: Muscle weakness (generalized)  Other abnormalities of gait and mobility     Problem List There are no active problems to display for this patient.   Neiman Roots W. 03/05/2018, 4:30 PM  Gean Maidens., PT   [facility] [address] [place], [place], [address] Phone: [phone]   Fax:  [phone]  Name: Kiriakos Maria MRN: [id number] Date of Birth: 07-05-1990

## 2018-03-08 ENCOUNTER — Ambulatory Visit: Payer: No Typology Code available for payment source | Admitting: Physical Therapy

## 2018-03-08 DIAGNOSIS — M25652 Stiffness of left hip, not elsewhere classified: Secondary | ICD-10-CM

## 2018-03-08 DIAGNOSIS — M6281 Muscle weakness (generalized): Secondary | ICD-10-CM

## 2018-03-08 DIAGNOSIS — M25552 Pain in left hip: Secondary | ICD-10-CM

## 2018-03-08 NOTE — Patient Instructions (Signed)
Access Code: [email or web address]  URL: [email or web address]  Date: 03/08/2018  Prepared by: Lonia Blood   Exercises  Supine ITB Stretch with Strap - 3 reps - 1 sets - 30-45 hold - 2-3x daily - 7x weekly  Romberg Stance Eyes Closed on Foam Pad - 10 reps - 1 sets - 1x daily - 7x weekly  Single Leg Stance on Foam Pad - 3 reps - 1 sets - 10-15 hold - 1x daily - 7x weekly  Supine Butterfly Groin Stretch - 3 reps - 1 sets - 30-45 hold - 2-3x daily - 7x weekly  Standing ITB Stretch - 3 reps - 1 sets - 15-30 sec hold - 1x daily - 7x weekly  Supine Figure 4 Piriformis Stretch - 3 reps - 1 sets - 15-30 sec hold - 1x daily - 7x weekly  Standing Hip Flexor Stretch - 3 reps - 1 sets - 15-30 sec hold - 1x daily - 7x weekly   Added 03/08/2018 Straight leg raise with external rotation - 10 reps - 2 sets - 1x daily - 5x weekly

## 2018-03-08 NOTE — Therapy (Signed)
[facility] [address] [place], [place], [address] Phone: [phone]   Fax:  [phone]  Physical Therapy Treatment  Patient Details  Name: Luis Arias MRN: [id number] Date of Birth: [birth year] Referring Provider (PT): Stormy Card, MD   Encounter Date: 03/08/2018  PT End of Session - 03/08/18 1714    Visit Number  7    Number of Visits  15    Date for PT Re-Evaluation  05/12/18    Authorization Type  VA     Authorization Time Period  11/10/17-08/10/18    Authorization - Visit Number  7    Authorization - Number of Visits  15    PT Start Time  1320    PT Stop Time  1406    PT Time Calculation (min)  46 min    Activity Tolerance  Patient tolerated treatment well    Behavior During Therapy  [facility] for tasks assessed/performed       No past medical history on file.  No past surgical history on file.  There were no vitals filed for this visit.  Subjective Assessment - 03/08/18 1324    Subjective  Still noticing a shifting, in the lower leg-more noticeable more often.  As I'm getting stronger through my hip, I just feel numb and uncomfortable in lower leg.    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  To get to the point to where I can participate in sports.    Currently in Pain?  Yes    Pain Score  5     Pain Location  Hip   lateral lower leg   Pain Orientation  Left    Pain Descriptors / Indicators  Aching;Sharp    Pain Onset  More than a month ago    Pain Frequency  Constant    Aggravating Factors   pulling into stretch position    Pain Relieving Factors  repositioning, good form with exercise                       [facility] Adult PT Treatment/Exercise - 03/08/18 0001      Exercises   Exercises  Knee/Hip       Knee/Hip Exercises: Stretches   Active Hamstring Stretch  Left;3 reps;30 seconds   Seated, then propped on 4" block, 3 reps     Knee/Hip Exercises: Standing   Lateral Step Up  Left;2 sets;10 reps;Hand Hold: 1;Step Height: 6"   Cues for upright posture to decrease L lateral lean   Lateral Step Up Limitations  Pt with SLS instability with lateral step ups, needing UE support for improved stability.  Also noted "clicking" in L ankle with each step up (pt with preference of no shoes on during PT exercises, and noted to go into increased L foot supination upon stepping up onto step.    Other Standing Knee Exercises  SLS LLE with R forward step taps to 6" step>into L hip extensor position, 2 sets x 10 reps.  SLS RLE on step to work on L hip elevation/depression off edge of step, 10 reps.     Other Standing Knee Exercises  Forward march and hold for SLS activity 10 reps, 2 sets, then forward lunge, 20 ft 2 reps      Knee/Hip  Exercises: Supine   Straight Leg Raises  Strengthening;Left;1 set;10 reps    Straight Leg Raises Limitations  With L hip slight external rotation for medial thigh strengthening    Other Supine Knee/Hip Exercises  Palpated patella R knee and L knee, with slight lateral tracking noted with knee extension on L.  Upon palpation, pt tender to palpation lateral posterior aspect below L knee (near fibular head); he notes this is where the "clicking or popping" sensation is location with HIp external rotation, knee flexion trying to go back to neutral hip and knee extension (he also noted some numbness in L ankle/lower leg with this poppling sensation)     He notes this clicking/popping sensation does not typically occur with standing, weightbearing or SLS activities on LLE.        PT Education - 03/08/18 1714    Education Details  Addition to HEP to address medial quad strengthening    Person(s) Educated  Patient    Methods  Explanation;Demonstration;Handout    Comprehension   Verbalized understanding;Returned demonstration       PT Short Term Goals - 02/11/18 1211      PT SHORT TERM GOAL #1   Title  Pt will be independent with HEP for improved strength, flexibility, and decreased pain.  TARGET 03/12/18    Time  4    Period  Weeks    Status  New    Target Date  03/12/18      PT SHORT TERM GOAL #2   Title  Pt will demonstrate improved hip internal rotation and hip extension by 5 degrees on L, for improved hip flexibility.    Time  4    Period  Weeks    Status  New    Target Date  03/12/18      PT SHORT TERM GOAL #3   Title  Pt will verbalize/demonstrate proper body mechanics and ADLs in functional activities for decreased pain and improved function.    Time  4    Period  Weeks    Status  New    Target Date  03/12/18        PT Long Term Goals - 02/11/18 1214      PT LONG TERM GOAL #1   Title  Pt will be independent with progressive HEP for improved flexibility, strength, and decreased pain.  TARGET 04/02/18    Time  7    Period  Weeks    Status  New    Target Date  04/02/18      PT LONG TERM GOAL #2   Title  FGA to be assessed, with goal to be written as appropriate to address high level balance and gait activities.    Time  7    Period  Weeks    Status  New    Target Date  04/02/18      PT LONG TERM GOAL #3   Title  Pt will verbalize at least 3 means to decrease pain with functional activities including exercise and gait.    Time  7    Period  Weeks    Status  New    Target Date  04/02/18      PT LONG TERM GOAL #4   Title  Pt will demonstrate improved hip extensor strength to at least 4/5 for decreased Trendelenburg gait pattern, improved stability with gait.    Time  7    Period  Weeks    Status  New  Target Date  04/02/18            Plan - 03/08/18 1715    Clinical Impression Statement  Pt continues to report and be concerned about pain at lateral aspect posterior to L knee near fibular head, which causes numbness at  times in lower leg; this area is where he describes popping/clicking sensation when coming out of hip external rotation/flexion and knee flexion into extended knee and neutral hip posture.  Pt is quite concerned with this and PT reiterates that he needs to talk with MD at his appointment tomorrow, due to this being beyond scope of the hip labrum tear.  Also, educated pt on benefit of performing his exercises with shoes (as he prefers to perform without shoes), due to his excess pronation/flat arches with SLS and that supportive shoewear may help with positioning of lower extremities.  Pt is progressing with SLS and strengthening activities, but continues to have 5/10 pain in hip and newer reported pain below knee.      Rehab Potential  Good    PT Frequency  2x / week    PT Duration  Other (comment)    PT Treatment/Interventions  ADLs/Self Care Home Management;Gait training;Functional mobility training;Therapeutic activities;Therapeutic exercise;Patient/family education;Neuromuscular re-education;Balance training;Manual techniques    PT Next Visit Plan  Continue SLS strengthening, gentle agility activities (as long as not restricted at MD visit 03/09/2018); compliant surface, functional lower extremity and core strengthening exercises   Check evals next visit   PT Home Exercise Plan  [id number] and additional MedBridge HEP in 02/26/2018 instructions    Consulted and Agree with Plan of Care  Patient       Patient will benefit from skilled therapeutic intervention in order to improve the following deficits and impairments:  Abnormal gait, Decreased mobility, Decreased range of motion, Decreased strength, Difficulty walking, Impaired flexibility, Postural dysfunction, Impaired sensation  Visit Diagnosis: Muscle weakness (generalized)  Stiffness of left hip, not elsewhere classified  Pain in left hip     Problem List There are no active problems to display for this patient.   Luis Arias  W. 03/08/2018, 5:21 PM  Gean Maidens., PT   [facility] [address] [place], [place], [address] Phone: [phone]   Fax:  [phone]  Name: Luis Arias MRN: [id number] Date of Birth: [birth year]

## 2018-03-11 ENCOUNTER — Encounter: Payer: Self-pay | Admitting: Physical Therapy

## 2018-03-11 ENCOUNTER — Ambulatory Visit: Payer: No Typology Code available for payment source | Admitting: Physical Therapy

## 2018-03-11 DIAGNOSIS — M6281 Muscle weakness (generalized): Secondary | ICD-10-CM | POA: Diagnosis not present

## 2018-03-11 DIAGNOSIS — M25652 Stiffness of left hip, not elsewhere classified: Secondary | ICD-10-CM

## 2018-03-11 NOTE — Patient Instructions (Addendum)
Sidestep squats, 10 reps each side  Wall squats, 10 reps -make sure to engage abdominal exercises  Forward marching, 10 reps, 2 sets-keeping trunk steady. Extensors / Rotators, Sitting    Sit, back straight, ankle crossed over opposite thigh. Grasping bent knee in both hands, gently rotate opposite shoulder toward bent knee and pull knee toward shoulder. Hold _15-30__ seconds.  Repeat __3_ times per session. Do _1-2__ sessions per day.  Copyright  VHI. All rights reserved.

## 2018-03-14 NOTE — Therapy (Signed)
[facility] [address] [place], [place], [address] Phone: [phone]   Fax:  [phone]  Physical Therapy Treatment  Patient Details  Name: Luis Arias MRN: [id number] Date of Birth: [birth year] Referring Provider (PT): Jarrett Ables, MD   Encounter Date: 03/11/2018  PT End of Session - 03/14/18 1748    Visit Number  8    Number of Visits  15    Date for PT Re-Evaluation  05/12/18    Authorization Type  [facility]     Authorization Time Period  11/10/17-08/10/18    Authorization - Visit Number  8    Authorization - Number of Visits  15    PT Start Time  1020    PT Stop Time  1100    PT Time Calculation (min)  40 min    Activity Tolerance  Patient tolerated treatment well    Behavior During Therapy  [facility] for tasks assessed/performed   Pt very talkative at beginning of session when PT asked questions about MD visit; needs to be redirected to questions asked.      History reviewed. No pertinent past medical history.  History reviewed. No pertinent surgical history.  There were no vitals filed for this visit.  Subjective Assessment - 03/14/18 1738    Subjective  Went to the MD on [place] on Tuesday.  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  To get to the point to where I can participate in sports.    Currently in Pain?  Yes    Pain Score  5     Pain Location  Hip   lateral lower leg/lateral L knee   Pain Orientation  Left    Pain Descriptors / Indicators  Aching    Pain Onset  More than a month ago    Pain Frequency  Constant    Aggravating Factors   pulling into stretch position (hip externally rotated, flexed, knee flexed) then into  neutral hip and extended knee position    Pain Relieving Factors  repositioning, good form with exercise; pt reports pain stays about the same no matter what         [facility] PT Assessment - 03/14/18 1740      PROM   PROM Assessment Site  Hip;Knee    Right/Left Hip  Right;Left    Left Hip External Rotation   45    Left Hip Internal Rotation   --   18   Right/Left Knee  Right;Left    Right Knee Extension  --   lacks 14 degrees from full extension   Left Knee Extension  lacks 10 degrees full extension                   [facility] Adult PT Treatment/Exercise - 03/14/18 0001      Exercises   Exercises  Knee/Hip      Knee/Hip Exercises: Stretches   Other Knee/Hip Stretches  Seated hip internal rotation stretch with crossed legs, 3 reps x 10 seconds-cues for technique      Knee/Hip Exercises: Standing   Wall Squat  1 set;10 reps    Wall Squat Limitations  Cues  for adominal activiation, to avoid going to low into squat position. 2nd set x 8 reps with pillow squeeze for adductor activaiton.    Other Standing Knee Exercises  SLS LLE with R forward step taps to 6" step, then 12" step, no UE support and cues for upright posture x 10 reps.  SLS LLE with lateral step taps to 6", 12" x 10 reps each, cues for upright posture to avoid L lateral lean in trunk.    Other Standing Knee Exercises  Forward march and hold, 10 ft, 2 reps, then sidestep squats x 5 reps R and L for leg stregnthening     SLS LLE reaching from overhead down to ground, 2 sets, 5 reps with supervision, pt visibly using ankle musculature for balance with this (as he prefers to perform exercises with no shoes)          PT Short Term Goals - 03/14/18 1741      PT SHORT TERM GOAL #1   Title  Pt will be independent with HEP for improved strength, flexibility, and decreased pain.  TARGET 03/12/18    Baseline  Pt needs cues for correct technique, to stay on task for each exercise before progressing to other exercises  he does on his own.    Time  4    Period  Weeks    Status  Partially Met      PT SHORT TERM GOAL #2   Title  Pt will demonstrate improved hip internal rotation and hip extension by 5 degrees on L, for improved hip flexibility.    Baseline  limited with pain    Time  4    Period  Weeks    Status  Not Met      PT SHORT TERM GOAL #3   Title  Pt will verbalize/demonstrate proper body mechanics and ADLs in functional activities for decreased pain and improved function.    Baseline  Pt has been instructed in neutral hip positioning with exercises and activities    Time  4    Period  Weeks    Status  Partially Met        PT Long Term Goals - 02/11/18 1214      PT LONG TERM GOAL #1   Title  Pt will be independent with progressive HEP for improved flexibility, strength, and decreased pain.  TARGET 04/02/18    Time  7    Period  Weeks    Status  New    Target Date  04/02/18      PT LONG TERM GOAL #2   Title  FGA to be assessed, with goal to be written as appropriate to address high level balance and gait activities.    Time  7    Period  Weeks    Status  New    Target Date  04/02/18      PT LONG TERM GOAL #3   Title  Pt will verbalize at least 3 means to decrease pain with functional activities including exercise and gait.    Time  7    Period  Weeks    Status  New    Target Date  04/02/18      PT LONG TERM GOAL #4   Title  Pt will demonstrate improved hip extensor strength to at least 4/5 for decreased Trendelenburg gait pattern, improved stability with gait.    Time  7    Period  Weeks    Status  New  Target Date  04/02/18            Plan - 03/14/18 1749    Clinical Impression Statement  Assessed STGs this visit, with pt partially meeting STG 1 and 3 for education in HEP and body mechanics.  Pt has been educated on HEP and in body mechanics and positioning; however, pt frequently needs to be redirected to appropriate exercises and avoiding activities which are  causing pain.  STG 2 not met for improved hip range of motion; hip internal rotation stretch added to address this today.  Some of today's visit spent of review of MD visit with pt (as notes not available prior to session).  Pt continues to be concerned over knee pain/clicking sensation and MRI is to be ordered.  Pt does not complain of increased knee pain with any weightbearing activities during PT session and tolerates standing SLS and hip strengthening exercises well.  Briefly discussed possible need to place pt on hold until MRI, but pt does not want to do this unless recommended by MD.    Rehab Potential  Good    PT Frequency  2x / week    PT Duration  Other (comment)    PT Treatment/Interventions  ADLs/Self Care Home Management;Gait training;Functional mobility training;Therapeutic activities;Therapeutic exercise;Patient/family education;Neuromuscular re-education;Balance training;Manual techniques    PT Next Visit Plan  Continue SLS strengthening, gentle agility activities (as long as not restricted at MD visit 03/09/2018 or upcoming MRI); compliant surface, functional lower extremity and core strengthening exercises    PT Home Exercise Plan  [id number] and additional MedBridge HEP in 02/26/2018 instructions    Consulted and Agree with Plan of Care  Patient       Patient will benefit from skilled therapeutic intervention in order to improve the following deficits and impairments:  Abnormal gait, Decreased mobility, Decreased range of motion, Decreased strength, Difficulty walking, Impaired flexibility, Postural dysfunction, Impaired sensation  Visit Diagnosis: Muscle weakness (generalized)  Stiffness of left hip, not elsewhere classified     Problem List There are no active problems to display for this patient.   Jackquelyn Sundberg W. 03/14/2018, 5:54 PM  Frazier Butt., PT   [facility] [address] [place], [place],  [address] Phone: [phone]   Fax:  [phone]  Name: Luis Arias MRN: [id number] Date of Birth: Aug 18, 1990

## 2018-03-15 ENCOUNTER — Ambulatory Visit: Payer: No Typology Code available for payment source | Admitting: Rehabilitation

## 2018-03-15 ENCOUNTER — Encounter: Payer: Self-pay | Admitting: Rehabilitation

## 2018-03-15 DIAGNOSIS — M25652 Stiffness of left hip, not elsewhere classified: Secondary | ICD-10-CM

## 2018-03-15 DIAGNOSIS — M6281 Muscle weakness (generalized): Secondary | ICD-10-CM | POA: Diagnosis not present

## 2018-03-15 DIAGNOSIS — R2689 Other abnormalities of gait and mobility: Secondary | ICD-10-CM

## 2018-03-15 DIAGNOSIS — M25552 Pain in left hip: Secondary | ICD-10-CM

## 2018-03-15 NOTE — Patient Instructions (Signed)
Access Code: [email or web address]  URL: [email or web address]  Date: 03/15/2018  Prepared by: Harriet Butte   Exercises  Supine Bridge with [employer] Between Knees - 10 reps - 2 sets - 1x daily - 5x weekly  Single Leg Bridge - 10 reps - 2 sets - 1x daily - 5x weekly  Modified Thomas Stretch - 3 reps - 1 sets - 30 sec hold - 1x daily - 5x weekly  Prone Hip Extension - 10 reps - 2 sets - 1x daily - 5x weekly  Sidelying Hip Adduction - 10 reps - 2 sets - 1x daily - 5x weekly  Sidelying Hip Abduction - 10 reps - 2 sets - 1x daily - 5x weekly  Warrior III - 5 reps - 2 sets - 1x daily - 5x weekly  Standing Repeated Hip Abduction on Foam Pad - 10 reps - 1 sets - 1x daily - 5x weekly

## 2018-03-15 NOTE — Therapy (Signed)
[facility] [address] [place], [place], [address] Phone: [phone]   Fax:  [phone]  Physical Therapy Treatment  Patient Details  Name: Luis Arias MRN: [id number] Date of Birth: 06/20/1990 Referring Provider (PT): Jarrett Ables, MD   Encounter Date: 03/15/2018  PT End of Session - 03/15/18 1509    Visit Number  9    Number of Visits  15    Date for PT Re-Evaluation  05/12/18    Authorization Type  [facility]     Authorization Time Period  11/10/17-08/10/18    Authorization - Visit Number  9    Authorization - Number of Visits  15    PT Start Time  1404    PT Stop Time  1445    PT Time Calculation (min)  41 min    Activity Tolerance  Patient tolerated treatment well    Behavior During Therapy  [facility] for tasks assessed/performed   Pt very talkative at beginning of session when PT asked questions about MD visit; needs to be redirected to questions asked.      History reviewed. No pertinent past medical history.  History reviewed. No pertinent surgical history.  There were no vitals filed for this visit.  Subjective Assessment - 03/15/18 1404    Subjective  Today was first day back at gym.  I really only did the exercises that you guys gave me.     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  To get to the point to where I can participate in sports.    Currently in Pain?  Yes    Pain Score  5     Pain Location  Hip    Pain Orientation  Left    Pain Descriptors / Indicators  Aching    Pain Type  Acute pain    Pain Onset  More than a month ago    Pain Frequency  Constant                       [facility] Adult PT Treatment/Exercise - 03/15/18 1405      Self-Care   Self-Care  Other Self-Care Comments    Other Self-Care  Comments   Lengthey discussion regarding POC as he only has 6 visits left until June.  Pt asking about requesting more visits.  PT educated that we could try, however they are not guaranteed therefore he would like to save some visits until he can get knee fully assessed.  Note that he has not gotten MRI done of L knee due to [facility].  He did demonstrate clicking/popping in L knee today.  He did not report pain during any exercises, however he reports that it will sometimes "pop out of place at random times."  Educated how progression of hip exercises would involve cutting/agility type exercises, however that this would impact knee and could do so negatively if there is already injury, therefore agree with plan to place on hold following this week's therapy.  Pt reporting that he would like to try and switch to some other medical plan so that this would not continue to hold him up.  PT educated that he is doing very well and would plan to ensure up to date HEP at next  visit and start to incorporate safe gym exercises for pt to begin.        Exercises   Exercises  Other Exercises    Other Exercises   For LLE stability:  Performed L SLS on foam airex x 20 secs progressing to warrior 3 position on foam.  This was slightly difficult and pt tends to compensate with overt trunk rotation, therefore had pt perform on solid ground with marked improvement in technique.  Performed x 2 sets of 5 reps during session.  Transitioned back to foam with L SLS performing R hip abduction x 10 reps with cues to keep RLE off airex if possible.  Educated on how to re-produce at home on pillow, folded yoga mat, etc.  Note that pt was agreeable to wearing shoes today during L SLS exercises to improve ankle stability and decrease stress at L knee.  Provided max education on this during session/exercises.  Ended session with lateral step down first from 6" step-lowering L heel to floor however this was too much stress  on knee and difficult for pt to do safely, therefore decreased to 4" step and performed x 5 reps with marked improvement-may add at next session.         Access Code: [email or web address]  URL: [email or web address]  Date: 03/15/2018  Prepared by: Cameron Sprang   Exercises  Supine Bridge with [employer] Between Knees - 10 reps - 2 sets - 1x daily - 5x weekly  Single Leg Bridge - 10 reps - 2 sets - 1x daily - 5x weekly  Modified Thomas Stretch - 3 reps - 1 sets - 30 sec hold - 1x daily - 5x weekly  Prone Hip Extension - 10 reps - 2 sets - 1x daily - 5x weekly  Sidelying Hip Adduction - 10 reps - 2 sets - 1x daily - 5x weekly  Sidelying Hip Abduction - 10 reps - 2 sets - 1x daily - 5x weekly  Warrior III - 5 reps - 2 sets - 1x daily - 5x weekly  Standing Repeated Hip Abduction on Foam Pad - 10 reps - 1 sets - 1x daily - 5x weekly        PT Education - 03/15/18 1509    Education Details  see self care, updated HEP     Person(s) Educated  Patient    Methods  Explanation;Demonstration;Handout    Comprehension  Verbalized understanding;Returned demonstration       PT Short Term Goals - 03/14/18 1741      PT SHORT TERM GOAL #1   Title  Pt will be independent with HEP for improved strength, flexibility, and decreased pain.  TARGET 03/12/18    Baseline  Pt needs cues for correct technique, to stay on task for each exercise before progressing to other exercises he does on his own.    Time  4    Period  Weeks    Status  Partially Met      PT SHORT TERM GOAL #2   Title  Pt will demonstrate improved hip internal rotation and hip extension by 5 degrees on L, for improved hip flexibility.    Baseline  limited with pain    Time  4    Period  Weeks    Status  Not Met      PT SHORT TERM GOAL #3   Title  Pt will verbalize/demonstrate proper body mechanics and ADLs in functional activities for decreased pain and improved function.  Baseline  Pt has been instructed in neutral hip  positioning with exercises and activities    Time  4    Period  Weeks    Status  Partially Met        PT Long Term Goals - 02/11/18 1214      PT LONG TERM GOAL #1   Title  Pt will be independent with progressive HEP for improved flexibility, strength, and decreased pain.  TARGET 04/02/18    Time  7    Period  Weeks    Status  New    Target Date  04/02/18      PT LONG TERM GOAL #2   Title  FGA to be assessed, with goal to be written as appropriate to address high level balance and gait activities.    Time  7    Period  Weeks    Status  New    Target Date  04/02/18      PT LONG TERM GOAL #3   Title  Pt will verbalize at least 3 means to decrease pain with functional activities including exercise and gait.    Time  7    Period  Weeks    Status  New    Target Date  04/02/18      PT LONG TERM GOAL #4   Title  Pt will demonstrate improved hip extensor strength to at least 4/5 for decreased Trendelenburg gait pattern, improved stability with gait.    Time  7    Period  Weeks    Status  New    Target Date  04/02/18            Plan - 03/15/18 1440    Clinical Impression Statement  Skilled session focused on discussion of how progression of L hip exercises are limited due to unknown issue in L knee.  MD has ordered MRI, however he has not gotten yet as it is waiting to be approved by [place].  Pt willing to be placed on hold following this week in order to save visits and find out what is going on with L knee to ensure safey.  Added two L hip stability/balance exercises to HEP.  Will plan to ensure safe/updated HEP and educate on safe gym exercises and place on hold following next visit.      Rehab Potential  Good    PT Frequency  2x / week    PT Duration  Other (comment)    PT Treatment/Interventions  ADLs/Self Care Home Management;Gait training;Functional mobility training;Therapeutic activities;Therapeutic exercise;Patient/family education;Neuromuscular re-education;Balance  training;Manual techniques    PT Next Visit Plan  Wants to go on hold following this week (wants to ask for more visits, but also wants to be on hold until hears about knee)-Go over HEP again and begin to add exercises he can safely do at gym.   Continue SLS strengthening, gentle agility activities (as long as not restricted at MD visit 03/09/2018 or upcoming MRI); compliant surface, functional lower extremity and core strengthening exercises    PT Home Exercise Plan  [id number] and additional MedBridge HEP in 02/26/2018 instructions    Consulted and Agree with Plan of Care  Patient       Patient will benefit from skilled therapeutic intervention in order to improve the following deficits and impairments:  Abnormal gait, Decreased mobility, Decreased range of motion, Decreased strength, Difficulty walking, Impaired flexibility, Postural dysfunction, Impaired sensation  Visit Diagnosis: Muscle weakness (generalized)  Stiffness of left hip,  not elsewhere classified  Other abnormalities of gait and mobility  Pain in left hip     Problem List There are no active problems to display for this patient.   Cameron Sprang, PT, MPT [facility] [address] [place], [place], [address] Phone: [phone]   Fax:  [phone] 03/15/18, 3:13 PM  Name: Luis Arias MRN: [id number] Date of Birth: 09-14-1990

## 2018-03-18 ENCOUNTER — Encounter: Payer: Self-pay | Admitting: Physical Therapy

## 2018-03-18 ENCOUNTER — Ambulatory Visit: Payer: No Typology Code available for payment source | Admitting: Physical Therapy

## 2018-03-18 DIAGNOSIS — M6281 Muscle weakness (generalized): Secondary | ICD-10-CM | POA: Diagnosis not present

## 2018-03-18 DIAGNOSIS — M25652 Stiffness of left hip, not elsewhere classified: Secondary | ICD-10-CM

## 2018-03-18 NOTE — Therapy (Signed)
[facility] [address] [place], [place], [address] Phone: [phone]   Fax:  [phone]  Physical Therapy Treatment  Patient Details  Name: Luis Arias MRN: [id number] Date of Birth: Apr 08, 1990 Referring Provider (PT): Jarrett Ables, MD   Encounter Date: 03/18/2018  PT End of Session - 03/18/18 1336    Visit Number  10    Number of Visits  15    Date for PT Re-Evaluation  05/12/18    Authorization Type  [facility]     Authorization Time Period  11/10/17-08/10/18    Authorization - Visit Number  10    Authorization - Number of Visits  15    PT Start Time  [phone]   pt arrives late   PT Stop Time  0935    PT Time Calculation (min)  40 min    Activity Tolerance  Patient tolerated treatment well    Behavior During Therapy  [facility] for tasks assessed/performed   Pt very talkative throughout visit in regards to hip, knee pain, awaiting MRI and MD/insurance changes; needs to be redirected to exercises.      History reviewed. No pertinent past medical history.  History reviewed. No pertinent surgical history.  There were no vitals filed for this visit.  Subjective Assessment - 03/18/18 0857    Subjective  Worked out good earlier this week.  Will be updating [facility] and insurance info at visit on 04/01/2018.  Still waiting for MRI.    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  To get to the point to where I can participate in sports.    Currently in Pain?  Yes    Pain Score  5     Pain Location  Hip   Lateral left leg   Pain Orientation  Left    Pain Descriptors / Indicators  Aching    Pain Onset  More than a month ago    Pain Frequency  Constant    Aggravating Factors   exercises make tingling, numbness worse    Pain Relieving Factors  repositioning,  pain pretty much stays the same                       [facility] Adult PT Treatment/Exercise - 03/18/18 0001      Exercises   Exercises  Other Exercises    Other Exercises   Reviewed HEP additions last visit:  Warrior III position in SLS progressed to on foam, 5 reps, then hip abduction x 10 reps on foam.  Pt tends to externally rotate with high kick on LLE, needing cues for technique to avoid L hip external rotation during this activity.  Progressed to hip extension R and LLE on foam, x 10 reps; L and R hamstring curls x 10 reps.  Pt does wear his shoes during exercise today.  Explained rationale of low reps with attention to quality and correct technique of movement, when LLE is performing movement and when LLE is in stance position.  Discussed progression of activities at gym:  to make sure perform HEP exercises and seated stepper/seated exercises that keep motion mainly in one plane, until he finds more out from MRI.      Knee/Hip Exercises: Aerobic   Stepper  SciFit Stepper, Level 3.5, lower extremities only,  x 6 minutes for strengthening; cues for neutral knee positioning             PT Education - 03/18/18 1335    Education Details  Updates to HEP; plans to hold on PT until MRI is completed; pt aware to contact PT with updates    Person(s) Educated  Patient    Methods  Explanation;Demonstration;Handout    Comprehension  Verbalized understanding;Returned demonstration       PT Short Term Goals - 03/14/18 1741      PT SHORT TERM GOAL #1   Title  Pt will be independent with HEP for improved strength, flexibility, and decreased pain.  TARGET 03/12/18    Baseline  Pt needs cues for correct technique, to stay on task for each exercise before progressing to other exercises he does on his own.    Time  4    Period  Weeks    Status  Partially Met      PT SHORT TERM GOAL #2   Title  Pt will demonstrate improved hip internal rotation and hip extension by 5 degrees on L,  for improved hip flexibility.    Baseline  limited with pain    Time  4    Period  Weeks    Status  Not Met      PT SHORT TERM GOAL #3   Title  Pt will verbalize/demonstrate proper body mechanics and ADLs in functional activities for decreased pain and improved function.    Baseline  Pt has been instructed in neutral hip positioning with exercises and activities    Time  4    Period  Weeks    Status  Partially Met        PT Long Term Goals - 02/11/18 1214      PT LONG TERM GOAL #1   Title  Pt will be independent with progressive HEP for improved flexibility, strength, and decreased pain.  TARGET 04/02/18    Time  7    Period  Weeks    Status  New    Target Date  04/02/18      PT LONG TERM GOAL #2   Title  FGA to be assessed, with goal to be written as appropriate to address high level balance and gait activities.    Time  7    Period  Weeks    Status  New    Target Date  04/02/18      PT LONG TERM GOAL #3   Title  Pt will verbalize at least 3 means to decrease pain with functional activities including exercise and gait.    Time  7    Period  Weeks    Status  New    Target Date  04/02/18      PT LONG TERM GOAL #4   Title  Pt will demonstrate improved hip extensor strength to at least 4/5 for decreased Trendelenburg gait pattern, improved stability with gait.    Time  7    Period  Weeks    Status  New    Target Date  04/02/18            Plan - 03/18/18 1337    Clinical Impression Statement  Pt continueing to await scheduling for MRI; therefore, pt is willing to put therapy on hold until after MRI is complete to assess pt's L knee pain.  Reviewed most recent HEP additions and added hip extension strengthening to HEP today, with strong emphasis  on correct technique and form of exercises from HEP and safe progression on appropriate aerobic machines at gym.  Pt agreeable to being placed on hold for PT, and he plans to follow-up with therapist and update on MRI results in  regards to future scheduling.    Rehab Potential  Good    PT Frequency  2x / week    PT Duration  Other (comment)    PT Treatment/Interventions  ADLs/Self Care Home Management;Gait training;Functional mobility training;Therapeutic activities;Therapeutic exercise;Patient/family education;Neuromuscular re-education;Balance training;Manual techniques    PT Next Visit Plan  Place PT on hold awaiting MRI results; pt to follow up with PT for updates.    PT Home Exercise Plan  [email or web address] and additional MedBridge HEP in 02/26/2018 instructions    Consulted and Agree with Plan of Care  Patient       Patient will benefit from skilled therapeutic intervention in order to improve the following deficits and impairments:  Abnormal gait, Decreased mobility, Decreased range of motion, Decreased strength, Difficulty walking, Impaired flexibility, Postural dysfunction, Impaired sensation  Visit Diagnosis: Muscle weakness (generalized)  Stiffness of left hip, not elsewhere classified     Problem List There are no active problems to display for this patient.   Ka Flammer W. 03/18/2018, 1:46 PM  Frazier Butt., PT   [facility] [address] [place], [place], [address] Phone: [phone]   Fax:  [phone]  Name: Luis Arias MRN: [id number] Date of Birth: [birth year]

## 2018-03-18 NOTE — Patient Instructions (Addendum)
Access Code: [email or web address]  URL: [email or web address]  Date: 03/18/2018  Prepared by: Lonia BloodAmy Arleatha Philipps   Exercises  Supine Bridge with [employer] Between Knees - 10 reps - 2 sets - 1x daily - 5x weekly  Single Leg Bridge - 10 reps - 2 sets - 1x daily - 5x weekly  Modified Thomas Stretch - 3 reps - 1 sets - 30 sec hold - 1x daily - 5x weekly  Prone Hip Extension - 10 reps - 2 sets - 1x daily - 5x weekly  Sidelying Hip Adduction - 10 reps - 2 sets - 1x daily - 5x weekly  Sidelying Hip Abduction - 10 reps - 2 sets - 1x daily - 5x weekly  Warrior III - 5 reps - 2 sets - 1x daily - 5x weekly  Standing Repeated Hip Abduction on Foam Pad - 10 reps - 1 sets - 1x daily - 5x weekly   Added 03/18/2018 Standing Repeated Hip Extension on Foam Pad - 10 reps - 3 sets - 1x daily - 7x weekly

## 2018-03-25 ENCOUNTER — Ambulatory Visit: Payer: No Typology Code available for payment source | Admitting: Physical Therapy

## 2018-03-29 ENCOUNTER — Ambulatory Visit: Payer: No Typology Code available for payment source | Admitting: Physical Therapy

## 2018-04-01 ENCOUNTER — Ambulatory Visit: Payer: No Typology Code available for payment source | Admitting: Rehabilitation

## 2018-04-05 ENCOUNTER — Ambulatory Visit: Payer: No Typology Code available for payment source | Admitting: Physical Therapy

## 2018-04-08 ENCOUNTER — Ambulatory Visit: Payer: No Typology Code available for payment source | Admitting: Physical Therapy

## 2018-05-13 ENCOUNTER — Telehealth (INDEPENDENT_AMBULATORY_CARE_PROVIDER_SITE_OTHER): Payer: Self-pay

## 2018-05-13 ENCOUNTER — Encounter (INDEPENDENT_AMBULATORY_CARE_PROVIDER_SITE_OTHER): Payer: Self-pay | Admitting: Orthopedic Surgery

## 2018-05-13 ENCOUNTER — Other Ambulatory Visit: Payer: Self-pay

## 2018-05-13 ENCOUNTER — Ambulatory Visit (INDEPENDENT_AMBULATORY_CARE_PROVIDER_SITE_OTHER): Payer: No Typology Code available for payment source | Admitting: Orthopedic Surgery

## 2018-05-13 DIAGNOSIS — S838X2A Sprain of other specified parts of left knee, initial encounter: Secondary | ICD-10-CM | POA: Diagnosis not present

## 2018-05-13 DIAGNOSIS — G8929 Other chronic pain: Secondary | ICD-10-CM | POA: Diagnosis not present

## 2018-05-13 DIAGNOSIS — M25562 Pain in left knee: Secondary | ICD-10-CM | POA: Diagnosis not present

## 2018-05-13 NOTE — Progress Notes (Signed)
Office Visit Note   Patient: Luis Arias           Date of Birth: 04/16/1990           MRN: [id number] Visit Date: 05/13/2018 Requested by: No referring provider defined for this encounter. PCP: Patient, No Pcp Per  Subjective: Chief Complaint  Patient presents with   Left Knee - Pain    HPI: Patient presents for evaluation of left knee issues.  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  He underwent what sounds like meniscal repair in July 2017.  Mechanism of injury was hyperflexion and valgus stress.  He has had some mechanical symptoms since that time.  Op note not available for review but the MRI scan of the left knee performed in 2019 is available.  I reviewed that scan with the patient and it does show what appears to be possible meniscal capsular junction injury on that lateral meniscus.  ACL appears to be intact.  Patient states that his left knee locks all the time on a daily basis.  He demonstrated for me that phenomenon today.  He is not in the [employer] anymore but this is a service-connected injury.  He is somewhat exasperated by his continued symptoms in the knee.  I do not have the op note available for review.              ROS: All systems reviewed are negative as they relate to the chief complaint within the history of present illness.  Patient denies  fevers or chills.   Assessment & Plan: Visit Diagnoses:  1. Chronic pain of left knee   2. Injury of meniscus of left knee, initial encounter     Plan: Impression is left knee pain with likely unstable lateral meniscus.  I think he is likely heading for repair.  Would like to get MRI arthrogram of that left knee just to confirm the pathology in the knee as well as get the op note that was done in 2017 at another facility.  When we have that information I think we can make the best decision for the patient regarding further surgical intervention.  He has stopped his rehab for his hip due to  these knee issues.  I will see him back after that study.  Follow-Up Instructions: Return for after MRI.   Orders:  Orders Placed This Encounter  Procedures   MR Knee Left w/ contrast   Arthrogram   No orders of the defined types were placed in this encounter.     Procedures: No procedures performed   Clinical Data: No additional findings.  Objective: Vital Signs: There were no vitals taken for this visit.  Physical Exam:   Constitutional: Patient appears well-developed HEENT:  Head: Normocephalic Eyes:EOM are normal Neck: Normal range of motion Cardiovascular: Normal rate Pulmonary/chest: Effort normal Neurologic: Patient is alert Skin: Skin is warm Psychiatric: Patient has normal mood and affect    Ortho Exam: Ortho exam demonstrates full active and passive range of motion of that left knee and right knee.  No effusion is present.  Collateral crucial ligaments are stable.  No postop rotatory instability is noted.  He does have with hyperflexion a locking of that knee which he can unlock with extension.  This is a painful event for him and this is the event that happens in the left knee on a daily basis.  Specialty Comments:  No specialty comments available.  Imaging: No results found.   PMFS History: There are no active problems to display for this patient.  History reviewed. No pertinent past medical history.  History reviewed. No pertinent family history.  History reviewed. No pertinent surgical history. Social History   Occupational History   Not on file  Tobacco Use   Smoking status: Not on file  Substance and Sexual Activity   Alcohol use: Not on file   Drug use: Not on file   Sexual activity: Not on file

## 2018-05-13 NOTE — Telephone Encounter (Signed)
IC s/w patient advised needed him to come sign medical release for [place] to be able to get surgery op note from procedure he had done at [facility] [place] 08/2015. He said he will come today or tomorrow to sign release.

## 2018-05-19 ENCOUNTER — Telehealth (INDEPENDENT_AMBULATORY_CARE_PROVIDER_SITE_OTHER): Payer: Self-pay | Admitting: Orthopedic Surgery

## 2018-06-22 ENCOUNTER — Other Ambulatory Visit: Payer: No Typology Code available for payment source

## 2018-06-23 ENCOUNTER — Telehealth (INDEPENDENT_AMBULATORY_CARE_PROVIDER_SITE_OTHER): Payer: Self-pay | Admitting: Orthopedic Surgery

## 2018-06-23 NOTE — Telephone Encounter (Signed)
05/13/2018 ov note faxed to [facility] [phone], [place]ona Rieger

## 2018-07-16 ENCOUNTER — Other Ambulatory Visit: Payer: Self-pay

## 2018-07-16 ENCOUNTER — Ambulatory Visit
Admission: RE | Admit: 2018-07-16 | Discharge: 2018-07-16 | Disposition: A | Discharge status: Home / Self Care | Payer: No Typology Code available for payment source | Source: Ambulatory Visit | Attending: Orthopedic Surgery | Admitting: Orthopedic Surgery

## 2018-07-16 DIAGNOSIS — M25562 Pain in left knee: Secondary | ICD-10-CM

## 2018-07-16 DIAGNOSIS — G8929 Other chronic pain: Secondary | ICD-10-CM

## 2018-07-16 MED ORDER — IOPAMIDOL (ISOVUE-M 200) INJECTION 41%
35.0000 mL | Freq: Once | INTRAMUSCULAR | Status: AC
  Administered 2018-07-16: 15:00:00 35 mL via INTRA_ARTICULAR

## 2018-08-04 ENCOUNTER — Other Ambulatory Visit: Payer: Self-pay

## 2018-08-04 ENCOUNTER — Ambulatory Visit (INDEPENDENT_AMBULATORY_CARE_PROVIDER_SITE_OTHER): Payer: No Typology Code available for payment source | Admitting: Orthopedic Surgery

## 2018-08-04 ENCOUNTER — Encounter: Payer: Self-pay | Admitting: Orthopedic Surgery

## 2018-08-04 DIAGNOSIS — S838X2D Sprain of other specified parts of left knee, subsequent encounter: Secondary | ICD-10-CM

## 2018-08-04 NOTE — Progress Notes (Signed)
Office Visit Note   Patient: Luis InglesKennon Arias           Date of Birth: May 21, 1990           MRN: [id number] Visit Date: 08/04/2018 Requested by: No referring provider defined for this encounter. PCP: Patient, No Pcp Per  Subjective: Chief Complaint  Patient presents with   Left Knee - Pain    HPI: Patient presents for follow-up of left knee.  He describes definite locking symptoms and actually demonstrated that for me in the clinic last visit.  Since have seen him he is had an MRI arthrogram of that left knee.  Had prior left knee arthroscopy with possible meniscal repair at that time in 2017 done elsewhere.  He also has had hip labral surgery.  Denies any personal or family history of DVT or pulmonary embolism              ROS: All systems reviewed are negative as they relate to the chief complaint within the history of present illness.  Patient denies  fevers or chills.   Assessment & Plan: Visit Diagnoses:  1. Injury of meniscus of left knee, subsequent encounter     Plan: Impression is left knee locking and mechanical symptoms with clear evidence on MRI arthrogram of peripheral tear of the lateral meniscus.  I think there is also a potential flap component in the gutter which I discussed with the radiologist.  I think that with the fluid interface between the meniscus and the capsule that he does have an unstable lateral meniscus.  Described for him treatment options which for him would be observation or surgical treatment.  I think surgical treatment can definitely help with the locking symptoms.  That would involve inside-out meniscal repair with about a 65% success rate.  Described to him the procedure in terms of abrading the interface between those 2 surfaces and then placing meniscal sutures tied on the outside of the capsule.  Patient understands the risk and benefits associated with that including but not limited to infection nerve vessel damage knee stiffness failure of the  repair as well as infection.  We will like to get this done sooner rather than later so we can get on with his life.  All questions answered.  [facility].  Follow-Up Instructions: No follow-ups on file.   Orders:  No orders of the defined types were placed in this encounter.  No orders of the defined types were placed in this encounter.     Procedures: No procedures performed   Clinical Data: No additional findings.  Objective: Vital Signs: There were no vitals taken for this visit.  Physical Exam:   Constitutional: Patient appears well-developed HEENT:  Head: Normocephalic Eyes:EOM are normal Neck: Normal range of motion Cardiovascular: Normal rate Pulmonary/chest: Effort normal Neurologic: Patient is alert Skin: Skin is warm Psychiatric: Patient has normal mood and affect    Ortho Exam: Ortho exam demonstrates full active and passive range of motion of that left knee.  Patient does have lateral joint line tenderness but stable ACL and PCL.  No posterior lateral rotatory instability is noted.  No other masses lymphadenopathy or skin changes noted in that knee region.  Extensor mechanism is intact.  Range of motion is full.  Specialty Comments:  No specialty comments available.  Imaging: No results found.   PMFS History:  There are no active problems to display for this patient.  History reviewed. No pertinent past medical history.  History reviewed. No pertinent family history.  History reviewed. No pertinent surgical history. Social History   Occupational History   Not on file  Tobacco Use   Smoking status: Current Some Day Smoker   Smokeless tobacco: Never Used  Substance and Sexual Activity   Alcohol use: Not on file   Drug use: Not on file   Sexual activity: Not on file

## 2018-08-26 ENCOUNTER — Encounter (HOSPITAL_COMMUNITY): Payer: Self-pay | Admitting: *Deleted

## 2018-08-26 NOTE — Progress Notes (Addendum)
Mr. [name] denies chest pain or shortness of breath.  Mr [name] states that he doesn't have any s/s of covid. I instructed Mr [name] to go to [facility] for COVID screen tomorrow, after that he should go how and only core living group should be in th house.   PCP is at [place], [place].

## 2018-08-27 ENCOUNTER — Other Ambulatory Visit (HOSPITAL_COMMUNITY)
Admission: RE | Admit: 2018-08-27 | Discharge: 2018-08-27 | Disposition: A | Discharge status: Home / Self Care | Payer: No Typology Code available for payment source | Source: Ambulatory Visit | Attending: Orthopedic Surgery | Admitting: Orthopedic Surgery

## 2018-08-27 DIAGNOSIS — Z01812 Encounter for preprocedural laboratory examination: Secondary | ICD-10-CM | POA: Diagnosis present

## 2018-08-27 DIAGNOSIS — Z1159 Encounter for screening for other viral diseases: Secondary | ICD-10-CM | POA: Insufficient documentation

## 2018-08-27 LAB — SARS CORONAVIRUS 2 (TAT 6-24 HRS): SARS Coronavirus 2: NEGATIVE

## 2018-08-29 NOTE — Anesthesia Preprocedure Evaluation (Addendum)
Anesthesia Evaluation  Patient identified by MRN, date of birth, ID band Patient awake    Reviewed: Allergy & Precautions, H&P , NPO status , Patient's Chart, lab work & pertinent test results  Airway Mallampati: I  TM Distance: >3 FB Neck ROM: Full    Dental no notable dental hx. (+) Teeth Intact   Pulmonary neg pulmonary ROS, Current Smoker,    Pulmonary exam normal breath sounds clear to auscultation       Cardiovascular Exercise Tolerance: Good negative cardio ROS Normal cardiovascular exam Rhythm:Regular Rate:Normal     Neuro/Psych PSYCHIATRIC DISORDERS Anxiety Depression PTSD (post-traumatic stress disorder)  "sees a specialist at [facility] negative neurological ROS  negative psych ROS   GI/Hepatic negative GI ROS, Neg liver ROS,   Endo/Other  negative endocrine ROS  Renal/GU negative Renal ROS  negative genitourinary   Musculoskeletal negative musculoskeletal ROS (+)   Abdominal   Peds negative pediatric ROS (+)  Hematology negative hematology ROS (+)   Anesthesia Other Findings Day of surgery medications reviewed with the patient.  Reproductive/Obstetrics negative OB ROS                            Anesthesia Physical Anesthesia Plan  ASA: II  Anesthesia Plan: General   Post-op Pain Management: GA combined w/ Regional for post-op pain   Induction:   PONV Risk Score and Plan: 1 and Ondansetron, Dexamethasone and Midazolam  Airway Management Planned: LMA and Oral ETT  Additional Equipment:   Intra-op Plan:   Post-operative Plan: Extubation in OR  Informed Consent: I have reviewed the patients History and Physical, chart, labs and discussed the procedure including the risks, benefits and alternatives for the proposed anesthesia with the patient or authorized representative who has indicated his/her understanding and acceptance.     Dental advisory given  Plan  Discussed with: Anesthesiologist and CRNA  Anesthesia Plan Comments: (Discussed both nerve block for pain relief post-op and GA; including NV, sore throat, dental injury, and pulmonary complications)        Anesthesia Quick Evaluation

## 2018-08-30 ENCOUNTER — Ambulatory Visit (HOSPITAL_COMMUNITY)
Admission: RE | Admit: 2018-08-30 | Discharge: 2018-08-30 | Disposition: A | Discharge status: Home / Self Care | Payer: No Typology Code available for payment source | Attending: Orthopedic Surgery | Admitting: Orthopedic Surgery

## 2018-08-30 ENCOUNTER — Ambulatory Visit (HOSPITAL_COMMUNITY): Payer: No Typology Code available for payment source | Admitting: Anesthesiology

## 2018-08-30 ENCOUNTER — Encounter (HOSPITAL_COMMUNITY): Payer: Self-pay | Admitting: Certified Registered"

## 2018-08-30 ENCOUNTER — Encounter (HOSPITAL_COMMUNITY)
Admission: RE | Disposition: A | Discharge status: Home / Self Care | Payer: Self-pay | Source: Home / Self Care | Attending: Orthopedic Surgery

## 2018-08-30 ENCOUNTER — Other Ambulatory Visit: Payer: Self-pay

## 2018-08-30 DIAGNOSIS — F17210 Nicotine dependence, cigarettes, uncomplicated: Secondary | ICD-10-CM | POA: Diagnosis not present

## 2018-08-30 DIAGNOSIS — M23204 Derangement of unspecified medial meniscus due to old tear or injury, left knee: Secondary | ICD-10-CM | POA: Insufficient documentation

## 2018-08-30 DIAGNOSIS — M25362 Other instability, left knee: Secondary | ICD-10-CM | POA: Diagnosis present

## 2018-08-30 DIAGNOSIS — M23201 Derangement of unspecified lateral meniscus due to old tear or injury, left knee: Secondary | ICD-10-CM

## 2018-08-30 DIAGNOSIS — M170 Bilateral primary osteoarthritis of knee: Secondary | ICD-10-CM | POA: Insufficient documentation

## 2018-08-30 HISTORY — DX: Gastro-esophageal reflux disease without esophagitis: K21.9

## 2018-08-30 HISTORY — DX: Anxiety disorder, unspecified: F41.9

## 2018-08-30 HISTORY — DX: Unspecified osteoarthritis, unspecified site: M19.90

## 2018-08-30 HISTORY — PX: KNEE ARTHROSCOPY WITH MENISCAL REPAIR: SHX5653

## 2018-08-30 HISTORY — DX: Post-traumatic stress disorder, unspecified: F43.10

## 2018-08-30 HISTORY — DX: Depression, unspecified: F32.A

## 2018-08-30 LAB — CBC
HCT: 44.3 % (ref 39.0–52.0)
Hemoglobin: 14.5 g/dL (ref 13.0–17.0)
MCH: 31.7 pg (ref 26.0–34.0)
MCHC: 32.7 g/dL (ref 30.0–36.0)
MCV: 96.7 fL (ref 80.0–100.0)
Platelets: 158 10*3/uL (ref 150–400)
RBC: 4.58 MIL/uL (ref 4.22–5.81)
RDW: 11.1 % — ABNORMAL LOW (ref 11.5–15.5)
WBC: 6.7 10*3/uL (ref 4.0–10.5)
nRBC: 0 % (ref 0.0–0.2)

## 2018-08-30 SURGERY — ARTHROSCOPY, KNEE, WITH MENISCUS REPAIR
Anesthesia: General | Site: Knee | Laterality: Left

## 2018-08-30 MED ORDER — ACETAMINOPHEN 325 MG PO TABS: 325.0000 mg | ORAL_TABLET | ORAL | Status: DC | PRN

## 2018-08-30 MED ORDER — BUPIVACAINE HCL 0.25 % IJ SOLN
INTRAMUSCULAR | Status: DC | PRN
  Administered 2018-08-30: 20 mL via INTRA_ARTICULAR

## 2018-08-30 MED ORDER — EPHEDRINE 5 MG/ML INJ
INTRAVENOUS | Status: AC
  Filled 2018-08-30: qty 10

## 2018-08-30 MED ORDER — EPINEPHRINE 1 MG/10ML IJ SOSY
PREFILLED_SYRINGE | INTRAMUSCULAR | Status: AC
  Filled 2018-08-30: qty 10

## 2018-08-30 MED ORDER — OXYCODONE HCL 5 MG PO TABS
ORAL_TABLET | ORAL | Status: AC
  Filled 2018-08-30: qty 1

## 2018-08-30 MED ORDER — PROPOFOL 10 MG/ML IV BOLUS
INTRAVENOUS | Status: AC
  Filled 2018-08-30: qty 20

## 2018-08-30 MED ORDER — DEXAMETHASONE SODIUM PHOSPHATE 10 MG/ML IJ SOLN
INTRAMUSCULAR | Status: AC
  Filled 2018-08-30: qty 1

## 2018-08-30 MED ORDER — ONDANSETRON HCL 4 MG/2ML IJ SOLN
INTRAMUSCULAR | Status: AC
  Filled 2018-08-30: qty 2

## 2018-08-30 MED ORDER — SUCCINYLCHOLINE CHLORIDE 200 MG/10ML IV SOSY
PREFILLED_SYRINGE | INTRAVENOUS | Status: AC
  Filled 2018-08-30: qty 10

## 2018-08-30 MED ORDER — MIDAZOLAM HCL 5 MG/5ML IJ SOLN
INTRAMUSCULAR | Status: DC | PRN
  Administered 2018-08-30: 2 mg via INTRAVENOUS

## 2018-08-30 MED ORDER — MEPERIDINE HCL 25 MG/ML IJ SOLN: 6.2500 mg | INTRAMUSCULAR | Status: DC | PRN

## 2018-08-30 MED ORDER — MORPHINE SULFATE (PF) 4 MG/ML IV SOLN
INTRAVENOUS | Status: DC | PRN
  Administered 2018-08-30: 4 mg

## 2018-08-30 MED ORDER — DEXMEDETOMIDINE HCL 200 MCG/2ML IV SOLN
INTRAVENOUS | Status: DC | PRN
  Administered 2018-08-30: 16 ug via INTRAVENOUS
  Administered 2018-08-30: 40 ug via INTRAVENOUS
  Administered 2018-08-30: 12 ug via INTRAVENOUS

## 2018-08-30 MED ORDER — PROPOFOL 10 MG/ML IV BOLUS
INTRAVENOUS | Status: DC | PRN
  Administered 2018-08-30: 200 mg via INTRAVENOUS

## 2018-08-30 MED ORDER — OXYCODONE HCL 5 MG/5ML PO SOLN: 5.0000 mg | Freq: Once | ORAL | Status: AC | PRN

## 2018-08-30 MED ORDER — DEXAMETHASONE SODIUM PHOSPHATE 4 MG/ML IJ SOLN
INTRAMUSCULAR | Status: DC | PRN
  Administered 2018-08-30: 8 mg via INTRAVENOUS

## 2018-08-30 MED ORDER — KETOROLAC TROMETHAMINE 30 MG/ML IJ SOLN
INTRAMUSCULAR | Status: AC
  Filled 2018-08-30: qty 1

## 2018-08-30 MED ORDER — LIDOCAINE 2% (20 MG/ML) 5 ML SYRINGE
INTRAMUSCULAR | Status: AC
  Filled 2018-08-30: qty 5

## 2018-08-30 MED ORDER — FENTANYL CITRATE (PF) 100 MCG/2ML IJ SOLN
INTRAMUSCULAR | Status: DC | PRN
  Administered 2018-08-30 (×2): 25 ug via INTRAVENOUS
  Administered 2018-08-30: 50 ug via INTRAVENOUS
  Administered 2018-08-30 (×2): 25 ug via INTRAVENOUS
  Administered 2018-08-30: 50 ug via INTRAVENOUS

## 2018-08-30 MED ORDER — ACETAMINOPHEN 160 MG/5ML PO SOLN: 325.0000 mg | ORAL | Status: DC | PRN

## 2018-08-30 MED ORDER — CLONIDINE HCL (ANALGESIA) 100 MCG/ML EP SOLN
EPIDURAL | Status: AC
  Filled 2018-08-30: qty 10

## 2018-08-30 MED ORDER — OXYCODONE HCL 5 MG PO TABS
5.0000 mg | ORAL_TABLET | Freq: Once | ORAL | Status: AC | PRN
  Administered 2018-08-30: 5 mg via ORAL

## 2018-08-30 MED ORDER — BUPIVACAINE-EPINEPHRINE (PF) 0.5% -1:200000 IJ SOLN
INTRAMUSCULAR | Status: DC | PRN
  Administered 2018-08-30: 10 mL

## 2018-08-30 MED ORDER — FENTANYL CITRATE (PF) 100 MCG/2ML IJ SOLN: 25.0000 ug | INTRAMUSCULAR | Status: DC | PRN

## 2018-08-30 MED ORDER — MORPHINE SULFATE (PF) 4 MG/ML IV SOLN
INTRAVENOUS | Status: AC
  Filled 2018-08-30: qty 2

## 2018-08-30 MED ORDER — BUPIVACAINE HCL (PF) 0.25 % IJ SOLN
INTRAMUSCULAR | Status: AC
  Filled 2018-08-30: qty 30

## 2018-08-30 MED ORDER — ONDANSETRON HCL 4 MG/2ML IJ SOLN
INTRAMUSCULAR | Status: DC | PRN
  Administered 2018-08-30: 4 mg via INTRAVENOUS

## 2018-08-30 MED ORDER — LACTATED RINGERS IV SOLN
INTRAVENOUS | Status: DC | PRN
  Administered 2018-08-30 (×2): via INTRAVENOUS

## 2018-08-30 MED ORDER — PHENYLEPHRINE 40 MCG/ML (10ML) SYRINGE FOR IV PUSH (FOR BLOOD PRESSURE SUPPORT)
PREFILLED_SYRINGE | INTRAVENOUS | Status: AC
  Filled 2018-08-30: qty 10

## 2018-08-30 MED ORDER — MIDAZOLAM HCL 2 MG/2ML IJ SOLN
INTRAMUSCULAR | Status: AC
  Filled 2018-08-30: qty 2

## 2018-08-30 MED ORDER — ONDANSETRON HCL 4 MG/2ML IJ SOLN: 4.0000 mg | Freq: Once | INTRAMUSCULAR | Status: DC | PRN

## 2018-08-30 MED ORDER — BUPIVACAINE-EPINEPHRINE (PF) 0.5% -1:200000 IJ SOLN
INTRAMUSCULAR | Status: AC
  Filled 2018-08-30: qty 30

## 2018-08-30 MED ORDER — EPINEPHRINE PF 1 MG/ML IJ SOLN
INTRAMUSCULAR | Status: DC | PRN
  Administered 2018-08-30: 4 mg

## 2018-08-30 MED ORDER — SODIUM CHLORIDE 0.9 % IR SOLN
Status: DC | PRN
  Administered 2018-08-30: 6000 mL

## 2018-08-30 MED ORDER — LIDOCAINE 2% (20 MG/ML) 5 ML SYRINGE
INTRAMUSCULAR | Status: DC | PRN
  Administered 2018-08-30: 60 mg via INTRAVENOUS
  Administered 2018-08-30: 40 mg via INTRAVENOUS

## 2018-08-30 MED ORDER — GLYCOPYRROLATE PF 0.2 MG/ML IJ SOSY
PREFILLED_SYRINGE | INTRAMUSCULAR | Status: DC | PRN
  Administered 2018-08-30: .2 mg via INTRAVENOUS
  Administered 2018-08-30: .1 mg via INTRAVENOUS

## 2018-08-30 MED ORDER — GLYCOPYRROLATE PF 0.2 MG/ML IJ SOSY
PREFILLED_SYRINGE | INTRAMUSCULAR | Status: AC
  Filled 2018-08-30: qty 1

## 2018-08-30 MED ORDER — CEFAZOLIN SODIUM-DEXTROSE 2-3 GM-%(50ML) IV SOLR
INTRAVENOUS | Status: DC | PRN
  Administered 2018-08-30: 2 g via INTRAVENOUS

## 2018-08-30 MED ORDER — EPHEDRINE SULFATE-NACL 50-0.9 MG/10ML-% IV SOSY
PREFILLED_SYRINGE | INTRAVENOUS | Status: DC | PRN
  Administered 2018-08-30: 5 mg via INTRAVENOUS

## 2018-08-30 MED ORDER — KETOROLAC TROMETHAMINE 30 MG/ML IJ SOLN
INTRAMUSCULAR | Status: DC | PRN
  Administered 2018-08-30: 30 mg via INTRAVENOUS

## 2018-08-30 MED ORDER — CLONIDINE HCL (ANALGESIA) 100 MCG/ML EP SOLN
EPIDURAL | Status: DC | PRN
  Administered 2018-08-30: 100 ug via INTRA_ARTICULAR

## 2018-08-30 MED ORDER — FENTANYL CITRATE (PF) 250 MCG/5ML IJ SOLN
INTRAMUSCULAR | Status: AC
  Filled 2018-08-30: qty 5

## 2018-08-30 SURGICAL SUPPLY — 80 items
ALCOHOL 70% 16 OZ (MISCELLANEOUS) ×3 IMPLANT
BANDAGE ACE 6X5 VEL STRL LF (GAUZE/BANDAGES/DRESSINGS) IMPLANT
BANDAGE ESMARK 6X9 LF (GAUZE/BANDAGES/DRESSINGS) IMPLANT
BLADE CLIPPER SURG (BLADE) IMPLANT
BLADE CUTTER GATOR 3.5 (BLADE) ×3 IMPLANT
BLADE GREAT WHITE 4.2 (BLADE) ×2 IMPLANT
BLADE GREAT WHITE 4.2MM (BLADE) ×1
BLADE SURG 10 STRL SS (BLADE) ×3 IMPLANT
BLADE SURG 15 STRL LF DISP TIS (BLADE) ×1 IMPLANT
BLADE SURG 15 STRL SS (BLADE) ×2
BNDG ELASTIC 6X10 VLCR STRL LF (GAUZE/BANDAGES/DRESSINGS) ×3 IMPLANT
BNDG ESMARK 6X9 LF (GAUZE/BANDAGES/DRESSINGS)
BUR OVAL 6.0 (BURR) IMPLANT
CANN ZONE NAVIGATOR RT DISP (ORTHOPEDIC DISPOSABLE SUPPLIES) ×1
CANNULA ZONE NAVIGATOR RT DISP (ORTHOPEDIC DISPOSABLE SUPPLIES) ×1 IMPLANT
COVER MAYO STAND STRL (DRAPES) ×3 IMPLANT
COVER SURGICAL LIGHT HANDLE (MISCELLANEOUS) ×3 IMPLANT
COVER WAND RF STERILE (DRAPES) IMPLANT
CUFF TOURN SGL QUICK 34 (TOURNIQUET CUFF)
CUFF TOURNIQUET SINGLE 44IN (TOURNIQUET CUFF) IMPLANT
CUFF TRNQT CYL 34X4.125X (TOURNIQUET CUFF) IMPLANT
DISSECTOR  3.8MM X 13CM (MISCELLANEOUS) ×2
DISSECTOR 3.8MM X 13CM (MISCELLANEOUS) ×1 IMPLANT
DRAPE ARTHROSCOPY W/POUCH 114 (DRAPES) ×3 IMPLANT
DRAPE HALF SHEET 40X57 (DRAPES) IMPLANT
DRAPE INCISE IOBAN 66X45 STRL (DRAPES) IMPLANT
DRAPE U-SHAPE 47X51 STRL (DRAPES) ×3 IMPLANT
DRSG TEGADERM 4X4.75 (GAUZE/BANDAGES/DRESSINGS) ×9 IMPLANT
DURAPREP 26ML APPLICATOR (WOUND CARE) ×3 IMPLANT
ELECT REM PT RETURN 9FT ADLT (ELECTROSURGICAL) ×3
ELECTRODE REM PT RTRN 9FT ADLT (ELECTROSURGICAL) ×1 IMPLANT
GAUZE SPONGE 4X4 12PLY STRL (GAUZE/BANDAGES/DRESSINGS) ×3 IMPLANT
GAUZE XEROFORM 1X8 LF (GAUZE/BANDAGES/DRESSINGS) ×3 IMPLANT
GLOVE BIOGEL PI IND STRL 7.5 (GLOVE) ×1 IMPLANT
GLOVE BIOGEL PI IND STRL 8 (GLOVE) ×1 IMPLANT
GLOVE BIOGEL PI INDICATOR 7.5 (GLOVE) ×2
GLOVE BIOGEL PI INDICATOR 8 (GLOVE) ×2
GLOVE ECLIPSE 7.0 STRL STRAW (GLOVE) ×3 IMPLANT
GLOVE SURG ORTHO 8.0 STRL STRW (GLOVE) ×3 IMPLANT
GOWN STRL REUS W/ TWL LRG LVL3 (GOWN DISPOSABLE) ×2 IMPLANT
GOWN STRL REUS W/ TWL XL LVL3 (GOWN DISPOSABLE) ×1 IMPLANT
GOWN STRL REUS W/TWL LRG LVL3 (GOWN DISPOSABLE) ×4
GOWN STRL REUS W/TWL XL LVL3 (GOWN DISPOSABLE) ×2
HANDLE ZONE NAVIGATOR (ORTHOPEDIC DISPOSABLE SUPPLIES) ×1 IMPLANT
KIT BASIN OR (CUSTOM PROCEDURE TRAY) ×3 IMPLANT
KIT TURNOVER KIT B (KITS) ×3 IMPLANT
MANIFOLD NEPTUNE II (INSTRUMENTS) IMPLANT
MID POST RT CANNULA AR7910R (ORTHOPEDIC DISPOSABLE SUPPLIES) ×2
NEEDLE 18GX1X1/2 (RX/OR ONLY) (NEEDLE) ×3 IMPLANT
NEEDLE HYPO 25GX1X1/2 BEV (NEEDLE) ×3 IMPLANT
NEEDLE MENISCUS FW SMALL (NEEDLE) ×18 IMPLANT
NEEDLE SUT 2-0 SCORPION KNEE (NEEDLE) IMPLANT
NS IRRIG 1000ML POUR BTL (IV SOLUTION) ×3 IMPLANT
PACK ARTHROSCOPY DSU (CUSTOM PROCEDURE TRAY) ×3 IMPLANT
PAD ARMBOARD 7.5X6 YLW CONV (MISCELLANEOUS) ×6 IMPLANT
PADDING CAST COTTON 6X4 STRL (CAST SUPPLIES) ×3 IMPLANT
PENCIL BUTTON HOLSTER BLD 10FT (ELECTRODE) ×3 IMPLANT
SOL PREP POV-IOD 4OZ 10% (MISCELLANEOUS) ×3 IMPLANT
SPONGE LAP 4X18 RFD (DISPOSABLE) ×3 IMPLANT
SUT ETHILON 3 0 PS 1 (SUTURE) ×15 IMPLANT
SUT FIBERWIRE 2-0 18 17.9 3/8 (SUTURE)
SUT MENISCAL KIT (KITS) IMPLANT
SUT VIC AB 0 CT1 27 (SUTURE) ×2
SUT VIC AB 0 CT1 27XBRD ANBCTR (SUTURE) ×1 IMPLANT
SUT VIC AB 2-0 CT1 27 (SUTURE) ×4
SUT VIC AB 2-0 CT1 TAPERPNT 27 (SUTURE) ×2 IMPLANT
SUTURE FIBERWR 2-0 18 17.9 3/8 (SUTURE) IMPLANT
SUTURE TAPE 2-0 MENISCS NDL (SUTURE) ×4 IMPLANT
SUTURETAPE 2-0 MENISCS NDL (SUTURE) ×12
SYR 20ML ECCENTRIC (SYRINGE) ×3 IMPLANT
SYR CONTROL 10ML LL (SYRINGE) IMPLANT
SYR TB 1ML LUER SLIP (SYRINGE) ×3 IMPLANT
TOWEL GREEN STERILE (TOWEL DISPOSABLE) ×3 IMPLANT
TOWEL GREEN STERILE FF (TOWEL DISPOSABLE) ×3 IMPLANT
TUBE CONNECTING 12'X1/4 (SUCTIONS) ×1
TUBE CONNECTING 12X1/4 (SUCTIONS) ×2 IMPLANT
TUBING ARTHROSCOPY IRRIG 16FT (MISCELLANEOUS) ×3 IMPLANT
WAND HAND CNTRL MULTIVAC 90 (MISCELLANEOUS) IMPLANT
WATER STERILE IRR 1000ML POUR (IV SOLUTION) ×3 IMPLANT
ZONE NAVIGATOR-HANDLE AR7900 (ORTHOPEDIC DISPOSABLE SUPPLIES) ×3

## 2018-08-30 NOTE — H&P (Signed)
Luis Arias is an 28 y.o. male.   Chief Complaint: Left knee instability HPI: Luis Arias is a 28 year old patient with left knee pain.  His left knee has been locking up since arthroscopic surgery several years ago.  I reviewed the MRI scan done after the surgery and he may have persistent meniscal instability on the lateral side.  He was able to demonstrate this instability in the clinic with deep flexion and locking of the knee.  Past Medical History:  Diagnosis Date  . Anxiety   . Arthritis    knees  . Depression   . GERD (gastroesophageal reflux disease)    controls with diet  . PTSD (post-traumatic stress disorder)    "sees a specialist at [facility]    Past Surgical History:  Procedure Laterality Date  . HIP ARTHROSCOPY W/ LABRAL REPAIR Left   . KNEE ARTHROSCOPY Left 08/2015  . KNEE ARTHROSCOPY Right 10/2008    History reviewed. No pertinent family history. Social History:  reports that he has been smoking cigarettes. He has smoked for the past 12.00 years. His smokeless tobacco use includes chew. He reports previous alcohol use. He reports previous drug use. Frequency: 7.00 times per week. Drug: Marijuana.  Allergies: No Known Allergies  Medications Prior to Admission  Medication Sig Dispense Refill  . bismuth subsalicylate (PEPTO BISMOL) 262 MG/15ML suspension Take 30 mLs by mouth every 6 (six) hours as needed for indigestion or diarrhea or loose stools.    Marland Kitchen ibuprofen (ADVIL) 200 MG tablet Take 600-800 mg by mouth daily as needed for moderate pain.    . Multiple Vitamin (MULTIVITAMIN WITH MINERALS) TABS tablet Take 1 tablet by mouth daily.      Results for orders placed or performed during the hospital encounter of 08/30/18 (from the past 48 hour(s))  CBC     Status: Abnormal   Collection Time: 08/30/18  6:46 AM  Result Value Ref Range   WBC 6.7 4.0 - 10.5 K/uL   RBC 4.58 4.22 - 5.81 MIL/uL   Hemoglobin 14.5 13.0 - 17.0 g/dL   HCT 44.3 39.0 - 52.0 %   MCV 96.7  80.0 - 100.0 fL   MCH 31.7 26.0 - 34.0 pg   MCHC 32.7 30.0 - 36.0 g/dL   RDW 11.1 (L) 11.5 - 15.5 %   Platelets 158 150 - 400 K/uL   nRBC 0.0 0.0 - 0.2 %    Comment: Performed at [facility], [place] [address], [place], [place] [address]   No results found.  Review of Systems  Musculoskeletal: Positive for joint pain.  All other systems reviewed and are negative.   Blood pressure (!) 158/78, pulse 63, temperature 98 F (36.7 C), temperature source Oral, resp. rate 20, height 6' (1.829 m), weight 97.5 kg, SpO2 100 %. Physical Exam  Constitutional: He appears well-developed.  HENT:  Head: Normocephalic.  Eyes: Pupils are equal, round, and reactive to light.  Neck: Normal range of motion.  Cardiovascular: Normal rate.  Respiratory: Effort normal.  Neurological: He is alert.  Skin: Skin is warm.  Psychiatric: He has a normal mood and affect.  Examination of the left knee demonstrates no effusion.  There is some lateral joint line tenderness.  Extensor mechanism is intact.  Collateral cruciate ligaments are stable.  No other masses lymphadenopathy or skin changes noted in that left knee region.  He does have some locking with that left knee when he goes in a squat position from deep flexion to extension.  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  I think based on his examination in the clinic as well as the MRI scan which shows some consistent disruption at the meniscocapsular junction that he may have meniscal instability.  Plan at this time would be inside-out meniscal repair after diagnostic arthroscopy and evaluation.  Risk and benefits are discussed including but not limited to infection nerve vessel damage potential recurrent instability of that meniscal repair as well as potential inability to repair that meniscus.  Patient understands the risks and benefits and wishes to  proceed.  All questions answered  Burnard BuntingG Scott Claribel Sachs, MD 08/30/2018, 7:17 AM

## 2018-08-30 NOTE — Op Note (Signed)
NAME: MAYAN, KLOEPFER MEDICAL RECORD AL:[id number] ACCOUNT [id number] DATE OF BIRTH:August 19, 1990 FACILITY: [facility] LOCATION: [facility] PHYSICIAN:Herminia Warren Randel Pigg, MD  OPERATIVE REPORT  DATE OF PROCEDURE:  08/30/2018  PREOPERATIVE DIAGNOSIS:  Left knee unstable lateral meniscal tear.  POSTOPERATIVE DIAGNOSIS:  Left knee unstable lateral meniscal tear.  PROCEDURE:  Left knee arthroscopy with inside-out meniscal repair.  SURGEON:  Meredith Pel, MD  ASSISTANT:  Laure Kidney, RNFA, and Lester ____, [place]  INDICATIONS:  Yvone Neu is a 28 year old patient with prior attempt at meniscal repair who has symptoms consistent with mechanical locking as well as instability of that lateral meniscus.  He presents now for operative management after explanation of risks and  benefits.  PROCEDURE IN DETAIL:  The patient was brought to the operating room where a timeout was called.  Left leg was examined under anesthesia and found to have good stability to varus and valgus stress along with intact ACL, PCL, LCL, and MCL.  The patient's  left leg was prescrubbed with alcohol and Betadine, allowed to air dry, prepped with DuraPrep solution and draped in a sterile manner.  Charlie Pitter was used to cover the operative field.  Tourniquet was not utilized.  Ten mL of Marcaine with epinephrine was  used to anesthetize the anterior inferolateral and anterior inferomedial portals.  Diagnostic arthroscopy was performed.  The medial compartment was intact.  ACL, PCL intact.  Patellofemoral compartment intact.  No loose bodies in the medial and lateral  gutter.  The lateral compartment demonstrated grade II changes over 50% of weightbearing surface area of both the medial femoral condyle as well as the lateral tibial plateau.  The patient had an unstable lateral meniscal tear at the meniscocapsular  junction, extending around half the circumference of the meniscal circumference itself posterior to anterior.  At this time, a meniscal  rasp and shaver were used to abrade the edges of the capsule and meniscus.  At this time, an incision was then made at  the level of the joint line posterolateral knee.  Skin and subcutaneous tissue were sharply divided.  Care was taken to avoid injury to both the peroneal nerve as well as the posterior neurovascular structures.  A plane was developed between the capsule  and the lateral head of the gastroc.  The sutures were then placed using the Arthrex suture tape, passing device from anterior to posterior.  Accessory transpatellar portal was utilized.  At this time, the sutures were retrieved and tied posterior to  anterior with the knee in about 90 degrees of flexion.  This gave a very stable repair.  All in all 7 suture tapes were utilized.  The tourniquet was then released.  The tourniquet was never up.  Following this, the knee joint was thoroughly irrigated  and the incision was thoroughly irrigated.  The meniscus was stable to probing.  The portals were then closed using 2-0 Vicryl and 3-0 nylon.  The incision was closed using 0 Vicryl, 2-0 Vicryl and 3-0 nylon.  A solution of Marcaine, morphine, and  clonidine was used to inject both the knee as well as the lateral incision.  This was plain Marcaine.  Impervious dressings were placed.  Ace wrap was placed.  The patient tolerated the procedure well without immediate complications.  He was transferred  to the recovery room in stable condition.  LN/NUANCE  D:08/30/2018 T:08/30/2018 JOB:[id number]

## 2018-08-30 NOTE — Anesthesia Postprocedure Evaluation (Signed)
Anesthesia Post Note  Patient: Luis Arias  Procedure(s) Performed: LEFT KNEE ARTHROSCOPY AND DEBRIEDMENT AND REPAIR (Left Knee)     Patient location during evaluation: PACU Anesthesia Type: General Level of consciousness: awake and alert Pain management: pain level controlled Vital Signs Assessment: post-procedure vital signs reviewed and stable Respiratory status: spontaneous breathing, nonlabored ventilation, respiratory function stable and patient connected to nasal cannula oxygen Cardiovascular status: blood pressure returned to baseline and stable Postop Assessment: no apparent nausea or vomiting Anesthetic complications: no    Last Vitals:  Vitals:   08/30/18 1130 08/30/18 1140  BP: 127/86 131/77  Pulse:    Resp:    Temp:    SpO2:      Last Pain:  Vitals:   08/30/18 1130  TempSrc:   PainSc: 7                  Tanairi Cypert

## 2018-08-30 NOTE — Anesthesia Procedure Notes (Signed)
Procedure Name: LMA Insertion Date/Time: 08/30/2018 7:33 AM Performed by: Orlie Dakin, CRNA Pre-anesthesia Checklist: Patient identified, Emergency Drugs available, Suction available and Patient being monitored Patient Re-evaluated:Patient Re-evaluated prior to induction Oxygen Delivery Method: Circle system utilized Preoxygenation: Pre-oxygenation with 100% oxygen Induction Type: IV induction LMA: LMA inserted LMA Size: 5.0 Tube type: Oral Number of attempts: 1 Placement Confirmation: positive ETCO2 Tube secured with: Tape Dental Injury: Teeth and Oropharynx as per pre-operative assessment

## 2018-08-30 NOTE — Transfer of Care (Signed)
Immediate Anesthesia Transfer of Care Note  Patient: Luis Arias  Procedure(s) Performed: LEFT KNEE ARTHROSCOPY AND DEBRIEDMENT AND REPAIR (Left Knee)  Patient Location: PACU  Anesthesia Type:General  Level of Consciousness: sedated and drowsy  Airway & Oxygen Therapy: Patient Spontanous Breathing and Patient connected to face mask oxygen  Post-op Assessment: Post -op Vital signs reviewed and stable and Post -op Vital signs reviewed and unstable, Anesthesiologist notified  Post vital signs: Reviewed and stable  Last Vitals:  Vitals Value Taken Time  BP 122/81 08/30/18 1013  Temp    Pulse 66 08/30/18 1013  Resp 17 08/30/18 1013  SpO2 100 % 08/30/18 1013  Vitals shown include unvalidated device data.  Last Pain:  Vitals:   08/30/18 0649  TempSrc:   PainSc: 0-No pain      Patients Stated Pain Goal: 2 (80/32/12 2482)  Complications: No apparent anesthesia complications

## 2018-08-30 NOTE — Brief Op Note (Signed)
   08/30/2018  10:10 AM  PATIENT:  Luis Arias  28 y.o. male  PRE-OPERATIVE DIAGNOSIS:  MENINSCAL TEAR  POST-OPERATIVE DIAGNOSIS:  MENINSCAL TEAR  PROCEDURE:  Procedure(s): LEFT KNEE ARTHROSCOPY AND DEBRIEDMENT AND REPAIR  SURGEON:  Surgeon(s): Marlou Sa, Tonna Corner, MD  ASSISTANT: Modena Slater RNFA and Magnant PA  ANESTHESIA:   general  EBL: 5 ml    Total I/O In: 1000 [I.V.:1000] Out: -   BLOOD ADMINISTERED: none  DRAINS: none   LOCAL MEDICATIONS USED: Marcaine morphine clonidine  SPECIMEN:  No Specimen  COUNTS:  YES  TOURNIQUET:  * Missing tourniquet times found for documented tourniquets in log: [id number] *  DICTATION: .Other Dictation: Dictation Number [id number]  PLAN OF CARE: Discharge to home after PACU  PATIENT DISPOSITION:  PACU - hemodynamically stable

## 2018-09-02 ENCOUNTER — Encounter (HOSPITAL_COMMUNITY): Payer: Self-pay | Admitting: Orthopedic Surgery

## 2018-09-06 ENCOUNTER — Ambulatory Visit (INDEPENDENT_AMBULATORY_CARE_PROVIDER_SITE_OTHER): Payer: No Typology Code available for payment source | Admitting: Orthopedic Surgery

## 2018-09-06 ENCOUNTER — Encounter: Payer: Self-pay | Admitting: Orthopedic Surgery

## 2018-09-06 ENCOUNTER — Other Ambulatory Visit: Payer: Self-pay

## 2018-09-06 DIAGNOSIS — S838X2D Sprain of other specified parts of left knee, subsequent encounter: Secondary | ICD-10-CM

## 2018-09-06 MED ORDER — TRAMADOL HCL 50 MG PO TABS: 50.0000 mg | ORAL_TABLET | Freq: Three times a day (TID) | ORAL | 0 refills | Status: AC | PRN

## 2018-09-06 NOTE — Progress Notes (Signed)
   Post-Op Visit Note   Patient: Luis Arias           Date of Birth: [birth year]           MRN: [id number] Visit Date: 09/06/2018 PCP: Patient, No Pcp Per   Assessment & Plan:  Chief Complaint: No chief complaint on file.  Visit Diagnoses: No diagnosis found.  Plan: Pt presents to the clinic s/p L knee inside-out meniscal repair on 08/30/18.  Pain is improving since surgery but pt complains of persistent swelling.  Incisions are healing well, sutures were removed in office today. Steri-strips were placed over the incisions and wrapped with an ace-wrap. Knee effusion present.  L knee was aspirated of 35 cc of fluid.  Encouraged patient to work on straight leg raises throughout his recovery to keep his quad strengthened.  Continue to partial-weightbear with crutches.  Cautioned patient not to flex his L knee past 90 degrees of flexion. Pt not taking Oxycodone or Robaxin as he doesn't like how it makes him feel; given prescription for Tramadol.  Pt will followup with the office in 4 weeks.    Follow-Up Instructions: No follow-ups on file.   Orders:  No orders of the defined types were placed in this encounter.  Meds ordered this encounter  Medications  . traMADol (ULTRAM) 50 MG tablet    Sig: Take 1 tablet (50 mg total) by mouth every 8 (eight) hours as needed.    Dispense:  30 tablet    Refill:  0    Imaging: No results found.  PMFS History: There are no active problems to display for this patient.  Past Medical History:  Diagnosis Date  . Anxiety   . Arthritis    knees  . Depression   . GERD (gastroesophageal reflux disease)    controls with diet  . PTSD (post-traumatic stress disorder)    "sees a specialist at [facility]    History reviewed. No pertinent family history.  Past Surgical History:  Procedure Laterality Date  . HIP ARTHROSCOPY W/ LABRAL REPAIR Left   . KNEE ARTHROSCOPY Left 08/2015  . KNEE ARTHROSCOPY Right 10/2008  . KNEE ARTHROSCOPY WITH MENISCAL  REPAIR Left 08/30/2018   Procedure: LEFT KNEE ARTHROSCOPY AND DEBRIEDMENT AND REPAIR;  Surgeon: Meredith Pel, MD;  Location: [place];  Service: Orthopedics;  Laterality: Left;   Social History   Occupational History  . Not on file  Tobacco Use  . Smoking status: Current Some Day Smoker    Years: 12.00    Types: Cigarettes  . Smokeless tobacco: Current User    Types: Chew  . Tobacco comment: < 1 cig a day  Substance and Sexual Activity  . Alcohol use: Not Currently  . Drug use: Not Currently    Frequency: 7.0 times per week    Types: Marijuana  . Sexual activity: Not on file

## 2018-09-16 ENCOUNTER — Encounter: Payer: Self-pay | Admitting: Orthopedic Surgery

## 2018-09-21 ENCOUNTER — Telehealth: Payer: Self-pay | Admitting: Orthopedic Surgery

## 2018-09-21 DIAGNOSIS — S838X2D Sprain of other specified parts of left knee, subsequent encounter: Secondary | ICD-10-CM

## 2018-09-21 NOTE — Telephone Encounter (Signed)
Ok for no flex beyond 90 quad strengthening tdwb 2 x week for 1st 4 weeks post op thx

## 2018-09-21 NOTE — Telephone Encounter (Signed)
Referral made Tried calling patient to advise done. No answer VM not set up to LM

## 2018-09-21 NOTE — Telephone Encounter (Signed)
Please advise on what referral should be. Thanks.

## 2018-09-21 NOTE — Telephone Encounter (Signed)
Patient called requesting a referral for PT.  He is wanting to be referred to [facility].  CB#[phone].  Thank you.

## 2018-09-23 DIAGNOSIS — M23201 Derangement of unspecified lateral meniscus due to old tear or injury, left knee: Secondary | ICD-10-CM

## 2018-09-24 ENCOUNTER — Ambulatory Visit: Payer: No Typology Code available for payment source | Attending: Orthopedic Surgery | Admitting: Physical Therapy

## 2018-09-24 ENCOUNTER — Encounter: Payer: Self-pay | Admitting: Physical Therapy

## 2018-09-24 ENCOUNTER — Other Ambulatory Visit: Payer: Self-pay

## 2018-09-24 DIAGNOSIS — M6281 Muscle weakness (generalized): Secondary | ICD-10-CM | POA: Diagnosis present

## 2018-09-24 DIAGNOSIS — M25562 Pain in left knee: Secondary | ICD-10-CM

## 2018-09-24 NOTE — Therapy (Signed)
[place], [place], [address] Phone: [phone]   Fax:  [phone]  Physical Therapy Evaluation  Patient Details  Name: Luis Arias MRN: [id number] Date of Birth: January 07, 1991 Referring Provider (PT): Marlou Sa Tonna Corner, MD   Encounter Date: 09/24/2018  PT End of Session - 09/24/18 0849    Visit Number  1    Number of Visits  16    Authorization Type  [place]    Authorization Time Period  Eval + 15 visits    Authorization - Visit Number  1    Authorization - Number of Visits  15    PT Start Time  0807    PT Stop Time  0847    PT Time Calculation (min)  40 min    Activity Tolerance  Patient tolerated treatment well    Behavior During Therapy  [facility] for tasks assessed/performed       Past Medical History:  Diagnosis Date  . Anxiety   . Arthritis    knees  . Depression   . GERD (gastroesophageal reflux disease)    controls with diet  . PTSD (post-traumatic stress disorder)    "sees a specialist [facility]    Past Surgical History:  Procedure Laterality Date  . HIP ARTHROSCOPY W/ LABRAL REPAIR Left   . KNEE ARTHROSCOPY Left 08/2015  . KNEE ARTHROSCOPY Right 10/2008  . KNEE ARTHROSCOPY WITH MENISCAL REPAIR Left 08/30/2018   Procedure: LEFT KNEE ARTHROSCOPY AND DEBRIEDMENT AND REPAIR;  Surgeon: Meredith Pel, MD;  Location: [place];  Service: Orthopedics;  Laterality: Left;    There were no vitals filed for this visit.   Subjective Assessment - 09/24/18 0810    Subjective  Labral repair back in Oct. Over the last week I have been stretching and no longer using crutches. Knee is very hard. Numbness in Lt lateral thigh since hip repair. There wsas a lot of fluid in the knee.    Pertinent History  knee surgery R meniscal tear 2016; L knee meniscal tear surgery July 2017; L hip acetabular tear s/p arthroscopic surgery 12/23/17    Patient Stated Goals  Trying to get strength and flexibility  back.  To get to the point to where I can participate in sports.    Currently in Pain?  Yes    Pain Location  Knee    Pain Orientation  Left    Pain Descriptors / Indicators  Tightness    Pain Type  Surgical pain    Pain Onset  1 to 4 weeks ago    Pain Frequency  Constant    Aggravating Factors   constant tightness and soreness    Pain Relieving Factors  moving around         [facility] PT Assessment - 09/24/18 0001      Assessment   Medical Diagnosis  Lt knee meniscus repair    Referring Provider (PT)  Meredith Pel, MD    Onset Date/Surgical Date  08/30/18    Next MD Visit  8/8    Prior Therapy  for hip last year      Precautions   Precaution Comments  TDWB, limit 90 deg flx 4 weeks      Restrictions   Other Position/Activity Restrictions  TDWB 4 wks      Balance Screen   Has the patient fallen in the past 6 months  No      [place]  Private residence      Prior Function   Vocation Requirements  front desk at gym      Cognition   Overall Cognitive Status  Within Functional Limits for tasks assessed      Sensation   Additional Comments  [facility]      Posture/Postural Control   Posture Comments  tends to sit with slouching posture      ROM / Strength   AROM / PROM / Strength  PROM      PROM   Left Knee Extension  0    Left Knee Flexion  90+      Strength   Left Hip Flexion  4/5    Left Hip Extension  4+/5    Left Hip ABduction  4/5    Right/Left Knee  Left    Left Knee Flexion  5/5    Left Knee Extension  5/5      Palpation   Palpation comment  tightness & TTP ITB hip abductors                Objective measurements completed on examination: See above findings.      [facility] Adult PT Treatment/Exercise - 09/24/18 0001      Knee/Hip Exercises: Stretches   Passive Hamstring Stretch Limitations  supine with strap- midline and ITB      Knee/Hip Exercises: Supine   Straight Leg Raises Limitations  lifting 1 inch +  slide to abduction      Knee/Hip Exercises: Sidelying   Hip ABduction Limitations  90/90 abduction             PT Education - 09/24/18 1033    Education Details  anatomy of condition, POC, HEP, exercise form/raitonale, precautions & rationale, proper stretching    Person(s) Educated  Patient    Methods  Explanation;Demonstration;Tactile cues;Verbal cues;Handout    Comprehension  Verbalized understanding;Returned demonstration;Verbal cues required;Tactile cues required;Need further instruction       PT Short Term Goals - 09/24/18 1045      PT SHORT TERM GOAL #1   Title  gross hip strenght 5/5    Baseline  see flowsheet    Time  4    Period  Weeks    Status  New    Target Date  10/22/18        PT Long Term Goals - 09/24/18 1046      PT LONG TERM GOAL #1   Title  Pt will be able to return to gym workouts    Baseline  limited by precautions at eval    Time  6    Period  Weeks    Status  New    Target Date  11/05/18      PT LONG TERM GOAL #2   Title  Pt will be independent in long term HEP for continued care    Baseline  will progress and establish as appropriate    Time  6    Period  Weeks    Status  New    Target Date  11/05/18      PT LONG TERM GOAL #3   Title  Pt will demo proper balance statically and dynamically in single leg stance to indicate biomechanical chain control    Baseline  unable to test at eval due to precautions    Time  6    Period  Weeks    Status  New    Target Date  11/05/18  Plan - 09/24/18 1036    Clinical Impression Statement  Pt presents to PT with complaints of chronic Lt knee pain s/p meniscal repair- was originally torn in 2017 and had a Lt hip labral repair last year. significant tightness in ITB with weakness along LE biomehcanical chain. Discussed proper stretching and importance of following precautions. Pt will benefit from skilled PT in order to address deficts and meet LTGs.    Personal Factors and  Comorbidities  Comorbidity 1    Comorbidities  labral repair, anxiety, PTSD    Examination-Activity Limitations  Locomotion Level;Sit;Sleep;Squat;Stairs;Stand    Examination-Participation Restrictions  Meal Prep   work   Stability/Clinical Decision Making  Stable/Uncomplicated    Clinical Decision Making  Low    Rehab Potential  Good    PT Frequency  2x / week    PT Duration  6 weeks    PT Treatment/Interventions  ADLs/Self Care Home Management;Cryotherapy;Electrical Stimulation;Ultrasound;Moist Heat;Iontophoresis 4mg /ml Dexamethasone;Gait training;Stair training;Functional mobility training;Therapeutic activities;Therapeutic exercise;Balance training;Patient/family education;Neuromuscular re-education;Manual techniques;Passive range of motion;Scar mobilization;Dry needling;Taping    PT Next Visit Plan  continue per protocol    PT Home Exercise Plan  supine HSS & ITB, long sitting gastroc stretch, supine hip flx+abd, sidelying hip abd    Consulted and Agree with Plan of Care  Patient       Patient will benefit from skilled therapeutic intervention in order to improve the following deficits and impairments:  Decreased range of motion, Difficulty walking, Increased muscle spasms, Decreased activity tolerance, Pain, Improper body mechanics, Impaired flexibility, Decreased balance, Decreased strength, Postural dysfunction  Visit Diagnosis: 1. Acute pain of left knee   2. Muscle weakness (generalized)        Problem List Patient Active Problem List   Diagnosis Date Noted  . 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 Goble Fudala PT, DPT 09/24/18 11:04 AM   [facility] [address] [place], [place], [address] Phone: [phone]   Fax:  [phone]  Name: Angelita InglesKennon Bentivegna MRN: [id number] Date of Birth: 07-15-1990

## 2018-09-28 ENCOUNTER — Ambulatory Visit: Payer: No Typology Code available for payment source | Attending: Orthopedic Surgery | Admitting: Physical Therapy

## 2018-09-28 ENCOUNTER — Encounter: Payer: No Typology Code available for payment source | Admitting: Physical Therapy

## 2018-09-28 ENCOUNTER — Encounter: Payer: Self-pay | Admitting: Physical Therapy

## 2018-09-28 ENCOUNTER — Other Ambulatory Visit: Payer: Self-pay

## 2018-09-28 DIAGNOSIS — M6281 Muscle weakness (generalized): Secondary | ICD-10-CM | POA: Diagnosis present

## 2018-09-28 DIAGNOSIS — M25562 Pain in left knee: Secondary | ICD-10-CM | POA: Insufficient documentation

## 2018-09-28 DIAGNOSIS — R2689 Other abnormalities of gait and mobility: Secondary | ICD-10-CM | POA: Diagnosis present

## 2018-09-28 DIAGNOSIS — M25552 Pain in left hip: Secondary | ICD-10-CM | POA: Diagnosis present

## 2018-09-28 DIAGNOSIS — M25652 Stiffness of left hip, not elsewhere classified: Secondary | ICD-10-CM

## 2018-09-28 NOTE — Therapy (Signed)
[place], [place], [address] Phone: [phone]   Fax:  [phone]  Physical Therapy Treatment  Patient Details  Name: Luis Arias MRN: [id number] Date of Birth: [birth year] Referring Provider (PT): Marlou Sa Tonna Corner, MD   Encounter Date: 09/28/2018  PT End of Session - 09/28/18 1428    Visit Number  2    Number of Visits  16    Authorization Type  VA    Authorization Time Period  Eval + 15 visits    PT Start Time  1423    PT Stop Time  1517    PT Time Calculation (min)  54 min    Activity Tolerance  Patient tolerated treatment well    Behavior During Therapy  [facility] for tasks assessed/performed       Past Medical History:  Diagnosis Date  . Anxiety   . Arthritis    knees  . Depression   . GERD (gastroesophageal reflux disease)    controls with diet  . PTSD (post-traumatic stress disorder)    "sees a specialist at [facility]    Past Surgical History:  Procedure Laterality Date  . HIP ARTHROSCOPY W/ LABRAL REPAIR Left   . KNEE ARTHROSCOPY Left 08/2015  . KNEE ARTHROSCOPY Right 10/2008  . KNEE ARTHROSCOPY WITH MENISCAL REPAIR Left 08/30/2018   Procedure: LEFT KNEE ARTHROSCOPY AND DEBRIEDMENT AND REPAIR;  Surgeon: Meredith Pel, MD;  Location: [place];  Service: Orthopedics;  Laterality: Left;    There were no vitals filed for this visit.  Subjective Assessment - 09/28/18 1425    Subjective  Pt reports dull usual knee pain, he listened to the past therapist and has taken it more easy.  Had some pain over the weekend and it scared him, however now it is better. No longer needs crutches and is WBAT now.    Patient Stated Goals  Trying to get strength and flexibility back.  To get to the point to where I can participate in sports.    Currently in Pain?  Yes    Pain Score  6     Pain Location  Knee    Pain Orientation  Left;Medial    Pain Descriptors / Indicators  Dull    Pain Type   Surgical pain    Pain Onset  1 to 4 weeks ago    Pain Frequency  Constant    Aggravating Factors   constant    Pain Relieving Factors  stretching         [facility] PT Assessment - 09/28/18 0001      Assessment   Medical Diagnosis  Lt knee meniscus repair      Precautions   Precaution Comments  WBAT now                    [facility] Adult PT Treatment/Exercise - 09/28/18 0001      Exercises   Exercises  Knee/Hip      Knee/Hip Exercises: Stretches   Passive Hamstring Stretch Limitations  seated with FWD leans foot straigt,h , in,out     Other Knee/Hip Stretches  BKTC stretch    Other Knee/Hip Stretches  Lt piriformis      Knee/Hip Exercises: Aerobic   Nustep  L6x6' LE only      Knee/Hip Exercises: Standing   SLS  Lt 2x8 FWD leans, VC for form    SLS with Vectors  Lt with Rt toe taps FWD/side/back, then swing  throughs    Other Standing Knee Exercises  30 reps rocking FWD/BWD with Lt LE in staggered stance for muscle activation and pregait heel/toe rocking      Knee/Hip Exercises: Supine   Bridges  Both;10 reps    Bridges with Clamshell  Strengthening;Both;10 reps   VC for form   Single Leg Bridge  Strengthening;Both;10 reps      Knee/Hip Exercises: Sidelying   Other Sidelying Knee/Hip Exercises  10 reps each Lt LE, pilates FWD/BWD kicks, CW/CCW circles and FWD/BWD arcs.       Modalities   Modalities  Vasopneumatic      Vasopneumatic   Number Minutes Vasopneumatic   15 minutes    Vasopnuematic Location   Knee   Lt   Vasopneumatic Pressure  Medium    Vasopneumatic Temperature   3*      Manual Therapy   Manual therapy comments  Lt knee has heat in it, (+) edema, and fair scar mobilization               PT Short Term Goals - 09/24/18 1045      PT SHORT TERM GOAL #1   Title  gross hip strenght 5/5    Baseline  see flowsheet    Time  4    Period  Weeks    Status  New    Target Date  10/22/18        PT Long Term Goals - 09/24/18 1046      PT  LONG TERM GOAL #1   Title  Pt will be able to return to gym workouts    Baseline  limited by precautions at eval    Time  6    Period  Weeks    Status  New    Target Date  11/05/18      PT LONG TERM GOAL #2   Title  Pt will be independent in long term HEP for continued care    Baseline  will progress and establish as appropriate    Time  6    Period  Weeks    Status  New    Target Date  11/05/18      PT LONG TERM GOAL #3   Title  Pt will demo proper balance statically and dynamically in single leg stance to indicate biomechanical chain control    Baseline  unable to test at eval due to precautions    Time  6    Period  Weeks    Status  New    Target Date  11/05/18            Plan - 09/28/18 1503    Clinical Impression Statement  Luis Arias fatigues in the Lt LE with standing strengthening and proprioception.  Had vaso at end of session due to edema and palpable heat in the knee.  He is highly motivated to improve and is walking without an assistive device today as he is now 4 wks post op.  No goals met only second visit.    Rehab Potential  Good    PT Frequency  2x / week    PT Duration  6 weeks    PT Treatment/Interventions  ADLs/Self Care Home Management;Cryotherapy;Electrical Stimulation;Ultrasound;Moist Heat;Iontophoresis 45m/ml Dexamethasone;Gait training;Stair training;Functional mobility training;Therapeutic activities;Therapeutic exercise;Balance training;Patient/family education;Neuromuscular re-education;Manual techniques;Passive range of motion;Scar mobilization;Dry needling;Taping    PT Next Visit Plan  continue per protocol       Patient will benefit from skilled therapeutic intervention in order to  improve the following deficits and impairments:  Decreased range of motion, Difficulty walking, Increased muscle spasms, Decreased activity tolerance, Pain, Improper body mechanics, Impaired flexibility, Decreased balance, Decreased strength, Postural dysfunction  Visit  Diagnosis: 1. Acute pain of left knee   2. Muscle weakness (generalized)   3. Stiffness of left hip, not elsewhere classified   4. Other abnormalities of gait and mobility   5. Pain in left hip        Problem List Patient Active Problem List   Diagnosis Date Noted  . 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

## 2018-09-30 ENCOUNTER — Other Ambulatory Visit: Payer: Self-pay

## 2018-09-30 ENCOUNTER — Encounter: Payer: Self-pay | Admitting: Physical Therapy

## 2018-09-30 ENCOUNTER — Encounter: Payer: No Typology Code available for payment source | Admitting: Physical Therapy

## 2018-09-30 ENCOUNTER — Ambulatory Visit: Payer: No Typology Code available for payment source | Admitting: Physical Therapy

## 2018-09-30 DIAGNOSIS — M25562 Pain in left knee: Secondary | ICD-10-CM

## 2018-09-30 DIAGNOSIS — M6281 Muscle weakness (generalized): Secondary | ICD-10-CM

## 2018-09-30 NOTE — Therapy (Signed)
[facility] [address] [place], [place], [address] Phone: [phone]   Fax:  [phone]  Physical Therapy Treatment  Patient Details  Name: Luis InglesKennon Bastarache MRN: [id number] Date of Birth: 08/12/1990 Referring Provider (PT): August Saucerean, Corrie MckusickGregory Scott, MD   Encounter Date: 09/30/2018  PT End of Session - 09/30/18 1020    Visit Number  3    Number of Visits  16    Authorization Type  VA    Authorization Time Period  Eval + 15 visits    Authorization - Number of Visits  15    PT Start Time  [phone]    PT Stop Time  1030    PT Time Calculation (min)  67 min    Activity Tolerance  Patient tolerated treatment well       Past Medical History:  Diagnosis Date  . Anxiety   . Arthritis    knees  . Depression   . GERD (gastroesophageal reflux disease)    controls with diet  . PTSD (post-traumatic stress disorder)    [facility]    Past Surgical History:  Procedure Laterality Date  . HIP ARTHROSCOPY W/ LABRAL REPAIR Left   . KNEE ARTHROSCOPY Left 08/2015  . KNEE ARTHROSCOPY Right 10/2008  . KNEE ARTHROSCOPY WITH MENISCAL REPAIR Left 08/30/2018   Procedure: LEFT KNEE ARTHROSCOPY AND DEBRIEDMENT AND REPAIR;  Surgeon: Cammy Copaean, Gregory Scott, MD;  [facility];  Service: Orthopedics;  Laterality: Left;    There were no vitals filed for this visit.  Subjective Assessment - 09/30/18 0936    Subjective  Pt reports he was significantly more sore, does feel like he is more loose in the knee    Currently in Pain?  No/denies         [facility] - 09/30/18 0001      Assessment   Next MD Visit  8/10                   [facility] Adult PT Treatment/Exercise - 09/30/18 0001      Exercises   Exercises  Knee/Hip      Knee/Hip Exercises: Aerobic   Nustep  L8x6' LE only      Knee/Hip Exercises: Standing   SLS  2x10 FWD leans    SLS with Vectors       Other Standing Knee Exercises  10 reps squats     Other Standing Knee Exercises  rebounder ball tosses on upside down bosu then SLS on green therapad tosses      Knee/Hip Exercises: Seated   Sit to Sand  20 reps;without UE support   single leg, each side, catching in back of Rt knee with this     Modalities   Modalities  Vasopneumatic;Cryotherapy      Cryotherapy   Number Minutes Cryotherapy  12 Minutes    Cryotherapy Location  Knee    Type of Cryotherapy  Ice pack      Vasopneumatic   Number Minutes Vasopneumatic   --   machine in use so ice pack used            PT Education - 09/30/18 0942    Education Details  HEP progression    Person(s) Educated  Patient    Methods  Explanation;Demonstration;Handout    Comprehension  Returned demonstration;Verbalized understanding;Verbal cues required       PT Short Term Goals - 09/24/18 1045      PT SHORT  TERM GOAL #1   Title  gross hip strenght 5/5    Baseline  see flowsheet    Time  4    Period  Weeks    Status  New    Target Date  10/22/18        PT Long Term Goals - 09/24/18 1046      PT LONG TERM GOAL #1   Title  Pt will be able to return to gym workouts    Baseline  limited by precautions at eval    Time  6    Period  Weeks    Status  New    Target Date  11/05/18      PT LONG TERM GOAL #2   Title  Pt will be independent in long term HEP for continued care    Baseline  will progress and establish as appropriate    Time  6    Period  Weeks    Status  New    Target Date  11/05/18      PT LONG TERM GOAL #3   Title  Pt will demo proper balance statically and dynamically in single leg stance to indicate biomechanical chain control    Baseline  unable to test at eval due to precautions    Time  6    Period  Weeks    Status  New    Target Date  11/05/18            Plan - 09/30/18 1016    Clinical Impression Statement  Pt did well with his new HEP, he did have pain in the back of the Rt - nonsurgical knee with single leg sit to stands.  This  settled down immediately after with gentle stretches and ice. Making good progress.    Rehab Potential  Good    PT Frequency  2x / week    PT Duration  6 weeks    PT Treatment/Interventions  ADLs/Self Care Home Management;Cryotherapy;Electrical Stimulation;Ultrasound;Moist Heat;Iontophoresis 4mg /ml Dexamethasone;Gait training;Stair training;Functional mobility training;Therapeutic activities;Therapeutic exercise;Balance training;Patient/family education;Neuromuscular re-education;Manual techniques;Passive range of motion;Scar mobilization;Dry needling;Taping    PT Next Visit Plan  assess tolerance to new HEP , continue with closed chain strengthening and proprioception work.    Consulted and Agree with Plan of Care  Patient       Patient will benefit from skilled therapeutic intervention in order to improve the following deficits and impairments:  Decreased range of motion, Difficulty walking, Increased muscle spasms, Decreased activity tolerance, Pain, Improper body mechanics, Impaired flexibility, Decreased balance, Decreased strength, Postural dysfunction  Visit Diagnosis: 1. Acute pain of left knee   2. Muscle weakness (generalized)        Problem List Patient Active Problem List   Diagnosis Date Noted  . 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

## 2018-10-04 ENCOUNTER — Encounter: Payer: Self-pay | Admitting: Orthopedic Surgery

## 2018-10-04 ENCOUNTER — Other Ambulatory Visit: Payer: Self-pay

## 2018-10-04 ENCOUNTER — Ambulatory Visit (INDEPENDENT_AMBULATORY_CARE_PROVIDER_SITE_OTHER): Payer: No Typology Code available for payment source | Admitting: Orthopedic Surgery

## 2018-10-04 DIAGNOSIS — S838X2D Sprain of other specified parts of left knee, subsequent encounter: Secondary | ICD-10-CM

## 2018-10-04 NOTE — Progress Notes (Signed)
   Post-Op Visit Note   Patient: Luis Arias           Date of Birth: Jul 21, 1990           MRN: [id number] Visit Date: 10/04/2018 PCP: Patient, No Pcp Per   Assessment & Plan:  Chief Complaint:  Chief Complaint  Patient presents with  . Left Knee - Follow-up   Visit Diagnoses:  1. Injury of meniscus of left knee, subsequent encounter     Plan: Patient presents for evaluation now 5 weeks out left knee arthroscopy with lateral meniscal repair.  Is having some occasional paresthesias and shooting pain in the peroneal distribution but has excellent ankle dorsiflexion strength on the left-hand side.  No calf tenderness is present.  Mild effusion is present and that is aspirated today.  Plan is to let him bend past 90 but not loadbearing past 90.  Come back in 6 weeks for final check.  Follow-Up Instructions: Return in about 6 weeks (around 11/15/2018).   Orders:  No orders of the defined types were placed in this encounter.  No orders of the defined types were placed in this encounter.   Imaging: No results found.  PMFS History: Patient Active Problem List   Diagnosis Date Noted  . 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 Anxiety   . Arthritis    knees  . Depression   . GERD (gastroesophageal reflux disease)    controls with diet  . PTSD (post-traumatic stress disorder)    "[facility]    History reviewed. No pertinent family history.  Past Surgical History:  Procedure Laterality Date  . HIP ARTHROSCOPY W/ LABRAL REPAIR Left   . KNEE ARTHROSCOPY Left 08/2015  . KNEE ARTHROSCOPY Right 10/2008  . KNEE ARTHROSCOPY WITH MENISCAL REPAIR Left 08/30/2018   Procedure: LEFT KNEE ARTHROSCOPY AND DEBRIEDMENT AND REPAIR;  Surgeon: Meredith Pel, MD;  Location: [place];  Service: Orthopedics;  Laterality: Left;   Social History   Occupational History  . Not on file  Tobacco Use  . Smoking  status: Current Some Day Smoker    Years: 12.00    Types: Cigarettes  . Smokeless tobacco: Current User    Types: Chew  . Tobacco comment: < 1 cig a day  Substance and Sexual Activity  . Alcohol use: Not Currently  . Drug use: Not Currently    Frequency: 7.0 times per week    Types: Marijuana  . Sexual activity: Not on file

## 2018-10-05 ENCOUNTER — Ambulatory Visit: Payer: No Typology Code available for payment source | Admitting: Physical Therapy

## 2018-10-05 ENCOUNTER — Encounter: Payer: Self-pay | Admitting: Physical Therapy

## 2018-10-05 DIAGNOSIS — M25562 Pain in left knee: Secondary | ICD-10-CM | POA: Diagnosis not present

## 2018-10-05 DIAGNOSIS — M6281 Muscle weakness (generalized): Secondary | ICD-10-CM

## 2018-10-05 NOTE — Therapy (Signed)
[place], [place], [address] Phone: [phone]   Fax:  [phone]  Physical Therapy Treatment  Patient Details  Name: Luis Arias MRN: [id number] Date of Birth: 1990/05/30 Referring Provider (PT): Marlou Sa Tonna Corner, MD   Encounter Date: 10/05/2018  PT End of Session - 10/05/18 0803    Visit Number  4    Number of Visits  16    Authorization Type  [place]    Authorization - Visit Number  4    Authorization - Number of Visits  15    Activity Tolerance  Patient tolerated treatment well    Behavior During Therapy  [facility] for tasks assessed/performed       Past Medical History:  Diagnosis Date  . Anxiety   . Arthritis    knees  . Depression   . GERD (gastroesophageal reflux disease)    controls with diet  . PTSD (post-traumatic stress disorder)    "sees a specialist at [facility]    Past Surgical History:  Procedure Laterality Date  . HIP ARTHROSCOPY W/ LABRAL REPAIR Left   . KNEE ARTHROSCOPY Left 08/2015  . KNEE ARTHROSCOPY Right 10/2008  . KNEE ARTHROSCOPY WITH MENISCAL REPAIR Left 08/30/2018   Procedure: LEFT KNEE ARTHROSCOPY AND DEBRIEDMENT AND REPAIR;  Surgeon: Meredith Pel, MD;  Location: [place];  Service: Orthopedics;  Laterality: Left;    There were no vitals filed for this visit.  Subjective Assessment - 10/05/18 0806    Subjective  Pt reports going to Dr. Marlou Sa and having fluid drained from his knee at last office visit 10-04-18 L knee. He did a peroneal nerve check which  was normal.  Pt brings new RX for another 1-2 x a week for 6 weekPt brought RX with OK for flexion > 90 degrees but no loading  during flexion, OK for short squats and leg extension exercises and introduce bike.    Pertinent History  knee surgery R meniscal tear 2016; L knee meniscal tear surgery July 2017; L hip acetabular tear s/p arthroscopic surgery 12/23/17    Patient Stated Goals  Trying to get strength and  flexibility back.  To get to the point to where I can participate in sports.    Currently in Pain?  Yes    Pain Score  4     Pain Location  Knee    Pain Orientation  Left    Pain Descriptors / Indicators  Dull;Sharp    Pain Onset  1 to 4 weeks ago    Pain Frequency  Constant    Aggravating Factors   putting any pressure on it.    Pain Relieving Factors  Ibuprofen                       [facility] Adult PT Treatment/Exercise - 10/05/18 0001      Exercises   Exercises  Knee/Hip      Knee/Hip Exercises: Stretches   Other Knee/Hip Stretches  CARS for left LE 20 x     Other Knee/Hip Stretches  Lt piriformis x 3 x 30 sec each,        Knee/Hip Exercises: Aerobic   Nustep  L8x6' LE only      Knee/Hip Exercises: Standing   Stairs  standing on BOSU ball with SLS and maintaining balance  for 4 minutes PT SBA    SLS  2x10 FWD leans    SLS with Vectors  Lt with  Rt toe taps FWD/side/back, then swing throughs    Other Standing Knee Exercises  3 x 10  reps  sink squats, standing partial squats with blue t band on left knee TKE with squat 2 x 10 reps    Other Standing Knee Exercises  rebounder ball tosses on upside down bosu then SLS on red therapad tosses, bil stance on bosu ball with yellow theraball      Knee/Hip Exercises: Seated   Sit to Sand  10 reps   sls on left 10 x     Knee/Hip Exercises: Supine   Bridges  Both;10 reps      Knee/Hip Exercises: Sidelying   Hip ABduction  Left;Strengthening;2 sets;10 reps      Modalities   Modalities  Vasopneumatic;Cryotherapy   on left     Cryotherapy   Number Minutes Cryotherapy  15 Minutes    Cryotherapy Location  Knee    Type of Cryotherapy  Ice pack   on right     Vasopneumatic   Number Minutes Vasopneumatic   15 minutes    Vasopnuematic Location   Knee    Vasopneumatic Pressure  Medium    Vasopneumatic Temperature   32             PT Education - 10/05/18 0830    Education Details  Added sink squat and TKE  partial squat with blue t band in standing to HEP,  , Explanation of healing time    Person(s) Educated  Patient    Methods  Explanation;Demonstration;Tactile cues;Verbal cues;Handout    Comprehension  Verbalized understanding;Returned demonstration       PT Short Term Goals - 10/05/18 0903      PT SHORT TERM GOAL #1   Title  gross hip strenght 5/5    Baseline  see flowsheet    Time  4    Period  Weeks        PT Long Term Goals - 09/24/18 1046      PT LONG TERM GOAL #1   Title  Pt will be able to return to gym workouts    Baseline  limited by precautions at eval    Time  6    Period  Weeks    Status  New    Target Date  11/05/18      PT LONG TERM GOAL #2   Title  Pt will be independent in long term HEP for continued care    Baseline  will progress and establish as appropriate    Time  6    Period  Weeks    Status  New    Target Date  11/05/18      PT LONG TERM GOAL #3   Title  Pt will demo proper balance statically and dynamically in single leg stance to indicate biomechanical chain control    Baseline  unable to test at eval due to precautions    Time  6    Period  Weeks    Status  New    Target Date  11/05/18            Plan - 10/05/18 0847    Clinical Impression Statement  Pt brought RX with OK for flexion > 90 degrees but no loading  during flexion, OK for short squats and leg extension exercises and introduce bike. Dr August Saucerean took fluid off knee during office visit. Pt with 4/10 pain and is continuing HEP with addition of mini squats and gentle stretching.  Pt is well motivated to continue to improve    Comorbidities  labral repair, anxiety, PTSD    Examination-Activity Limitations  Locomotion Level;Sit;Sleep;Squat;Stairs;Stand    Stability/Clinical Decision Making  Stable/Uncomplicated    Rehab Potential  Good    PT Frequency  2x / week    PT Treatment/Interventions  ADLs/Self Care Home Management;Cryotherapy;Electrical Stimulation;Ultrasound;Moist  Heat;Iontophoresis 4mg /ml Dexamethasone;Gait training;Stair training;Functional mobility training;Therapeutic activities;Therapeutic exercise;Balance training;Patient/family education;Neuromuscular re-education;Manual techniques;Passive range of motion;Scar mobilization;Dry needling;Taping    PT Next Visit Plan  Pt brought RX with OK for flexion > 90 degrees but no loading  during flexion, OK for short squats and leg extension exercises and introduce bike. check goals and check hips/ankles    PT Home Exercise Plan  supine HSS & ITB, long sitting gastroc stretch, supine hip flx+abd, sidelying hip abd mini squats , TKE squats    Consulted and Agree with Plan of Care  Patient       Patient will benefit from skilled therapeutic intervention in order to improve the following deficits and impairments:  Decreased range of motion, Difficulty walking, Increased muscle spasms, Decreased activity tolerance, Pain, Improper body mechanics, Impaired flexibility, Decreased balance, Decreased strength, Postural dysfunction  Visit Diagnosis: 1. Acute pain of left knee   2. Muscle weakness (generalized)        Problem List Patient Active Problem List   Diagnosis Date Noted  . 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

## 2018-10-05 NOTE — Patient Instructions (Addendum)
Every day please do supine ( on your back) left lower extremity CARS ( controlled articular rotations )   With leg in 90/90 position point toe inward and extend knee then point toe outward and bend knee.  Do 10-20 CARS       Luis Arias, PT Certified Exercise Expert for the Aging Adult  10/05/18 8:42 AM Phone: [phone] Fax: [phone]

## 2018-10-08 ENCOUNTER — Ambulatory Visit: Payer: No Typology Code available for payment source | Admitting: Physical Therapy

## 2018-10-08 ENCOUNTER — Encounter

## 2018-10-12 ENCOUNTER — Other Ambulatory Visit: Payer: Self-pay

## 2018-10-12 ENCOUNTER — Encounter: Payer: No Typology Code available for payment source | Admitting: Physical Therapy

## 2018-10-12 ENCOUNTER — Ambulatory Visit: Payer: No Typology Code available for payment source | Admitting: Physical Therapy

## 2018-10-12 ENCOUNTER — Encounter: Payer: Self-pay | Admitting: Physical Therapy

## 2018-10-12 DIAGNOSIS — M6281 Muscle weakness (generalized): Secondary | ICD-10-CM

## 2018-10-12 DIAGNOSIS — M25562 Pain in left knee: Secondary | ICD-10-CM

## 2018-10-12 NOTE — Therapy (Signed)
[facility] [address] [place], [place], [address] Phone: [phone]   Fax:  [phone]  Physical Therapy Treatment  Patient Details  Name: Luis InglesKennon Beutler MRN: [id number] Date of Birth: Aug 29, 1990 Referring Provider (PT): August Saucerean, Corrie MckusickGregory Scott, MD   Encounter Date: 10/12/2018  PT End of Session - 10/12/18 1021    Visit Number  5    Number of Visits  16    Authorization Type  VA    Authorization Time Period  Eval + 15 visits    Authorization - Visit Number  5    Authorization - Number of Visits  15    PT Start Time  1018    PT Stop Time  1059    PT Time Calculation (min)  41 min    Activity Tolerance  Patient tolerated treatment well    Behavior During Therapy  WFL for tasks assessed/performed       Past Medical History:  Diagnosis Date  . Anxiety   . Arthritis    knees  . Depression   . GERD (gastroesophageal reflux disease)    controls with diet  . PTSD (post-traumatic stress disorder)    [facility]    Past Surgical History:  Procedure Laterality Date  . HIP ARTHROSCOPY W/ LABRAL REPAIR Left   . KNEE ARTHROSCOPY Left 08/2015  . KNEE ARTHROSCOPY Right 10/2008  . KNEE ARTHROSCOPY WITH MENISCAL REPAIR Left 08/30/2018   Procedure: LEFT KNEE ARTHROSCOPY AND DEBRIEDMENT AND REPAIR;  Surgeon: Cammy Copaean, Gregory Scott, MD;  [facility];  Service: Orthopedics;  Laterality: Left;    There were no vitals filed for this visit.  Subjective Assessment - 10/12/18 1020    Subjective  Feels like I have been stretching a lot lately. Feels a little shaky at times.    Patient Stated Goals  Trying to get strength and flexibility back.  To get to the point to where I can participate in sports.                       [facility] Adult PT Treatment/Exercise - 10/12/18 0001      Knee/Hip Exercises: Aerobic   Elliptical  5 min L1 ramp 4      Knee/Hip Exercises: Standing   Heel Raises  Both;10  reps    Heel Raises Limitations  double lift, single lower    Rocker Board Limitations  A/P&lat, static & dynamic    SLS  knee drive & reach back on airex    Other Standing Knee Exercises  wobble board mini squats    Other Standing Knee Exercises  heel raise toes turned out x15               PT Short Term Goals - 10/05/18 0903      PT SHORT TERM GOAL #1   Title  gross hip strenght 5/5    Baseline  see flowsheet    Time  4    Period  Weeks        PT Long Term Goals - 09/24/18 1046      PT LONG TERM GOAL #1   Title  Pt will be able to return to gym workouts    Baseline  limited by precautions at eval    Time  6    Period  Weeks    Status  New    Target Date  11/05/18      PT LONG TERM  GOAL #2   Title  Pt will be independent in long term HEP for continued care    Baseline  will progress and establish as appropriate    Time  6    Period  Weeks    Status  New    Target Date  11/05/18      PT LONG TERM GOAL #3   Title  Pt will demo proper balance statically and dynamically in single leg stance to indicate biomechanical chain control    Baseline  unable to test at eval due to precautions    Time  6    Period  Weeks    Status  New    Target Date  11/05/18            Plan - 10/12/18 1038    Clinical Impression Statement  Utilized wobble board for proprioception and balance control which was very difficult. Weakness noted in exercises and complaints of foot pain/cramping.    PT Treatment/Interventions  ADLs/Self Care Home Management;Cryotherapy;Electrical Stimulation;Ultrasound;Moist Heat;Iontophoresis 4mg /ml Dexamethasone;Gait training;Stair training;Functional mobility training;Therapeutic activities;Therapeutic exercise;Balance training;Patient/family education;Neuromuscular re-education;Manual techniques;Passive range of motion;Scar mobilization;Dry needling;Taping    PT Next Visit Plan  flx >90 OK but no loading over 90; standing stability & ankle control,  foot strengthening    PT Home Exercise Plan  supine HSS & ITB, long sitting gastroc stretch, supine hip flx+abd, sidelying hip abd mini squats , TKE squats, eccentric heel raise, single leg running balance, heel raises turn out    Consulted and Agree with Plan of Care  Patient       Patient will benefit from skilled therapeutic intervention in order to improve the following deficits and impairments:  Decreased range of motion, Difficulty walking, Increased muscle spasms, Decreased activity tolerance, Pain, Improper body mechanics, Impaired flexibility, Decreased balance, Decreased strength, Postural dysfunction  Visit Diagnosis: 1. Acute pain of left knee   2. Muscle weakness (generalized)        Problem List Patient Active Problem List   Diagnosis Date Noted  . 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 Rayyan Burley PT, DPT 10/12/18 11:05 AM   [facility] [address] [place], [place], [address] Phone: [phone]   Fax:  [phone]  Name: Luis Arias MRN: [id number] Date of Birth: 12-22-1990

## 2018-10-15 ENCOUNTER — Ambulatory Visit: Payer: No Typology Code available for payment source | Admitting: Physical Therapy

## 2018-10-15 ENCOUNTER — Encounter: Payer: Self-pay | Admitting: Physical Therapy

## 2018-10-15 ENCOUNTER — Other Ambulatory Visit: Payer: Self-pay

## 2018-10-15 DIAGNOSIS — M6281 Muscle weakness (generalized): Secondary | ICD-10-CM

## 2018-10-15 DIAGNOSIS — M25562 Pain in left knee: Secondary | ICD-10-CM | POA: Diagnosis not present

## 2018-10-15 NOTE — Therapy (Signed)
[facility] [address] [place], [place], [address] Phone: [phone]   Fax:  [phone]  Physical Therapy Treatment  Patient Details  Name: Luis Arias MRN: [id number] Date of Birth: [birth year] Referring Provider (PT): August Saucerean, Corrie MckusickGregory Scott, MD   Encounter Date: 10/15/2018  PT End of Session - 10/15/18 0723    Visit Number  6    Number of Visits  16    Date for PT Re-Evaluation  05/12/18    Authorization Type  VA    Authorization Time Period  Eval + 15 visits    Authorization - Visit Number  6    Authorization - Number of Visits  15    PT Start Time  0717    PT Stop Time  0802    PT Time Calculation (min)  45 min    Activity Tolerance  Patient tolerated treatment well    Behavior During Therapy  [facility] for [facility] assessed/performed       Past Medical History:  Diagnosis Date  . Anxiety   . Arthritis    knees  . Depression   . GERD (gastroesophageal reflux disease)    controls with diet  . PTSD (post-traumatic stress disorder)    [facility]    Past Surgical History:  Procedure Laterality Date  . HIP ARTHROSCOPY W/ LABRAL REPAIR Left   . KNEE ARTHROSCOPY Left 08/2015  . KNEE ARTHROSCOPY Right 10/2008  . KNEE ARTHROSCOPY WITH MENISCAL REPAIR Left 08/30/2018   Procedure: LEFT KNEE ARTHROSCOPY AND DEBRIEDMENT AND REPAIR;  Surgeon: Cammy Copaean, Gregory Scott, MD;  [facility];  Service: Orthopedics;  Laterality: Left;    There were no vitals filed for this visit.  Subjective Assessment - 10/15/18 0721    Subjective  Started feeling some pain in Rt hip flexor and post Rt knee, laterally.    Currently in Pain?  No/denies                       [facility] Treatment/Exercise - 10/15/18 0001      Pilates   Pilates Tower  see note      Knee/Hip Exercises: Aerobic   Elliptical  5 min ramp 6 L6      Manual Therapy   Manual therapy comments  STM bil HS and gastroc          Pilates Tower for LE/Core strength, postural strength, lumbopelvic disassociation and core control.  Exercises included: all with yellow springs Supine Leg [place]- single leg & double leg TT-press, straight leg press down, straight leg circles Sidelying Leg [place]- TT-press, abd/adduction press, large circles  Piriformis & hamstring stretch         PT Short Term Goals - 10/05/18 0903      PT SHORT TERM GOAL #1   Title  gross hip strenght 5/5    Baseline  see flowsheet    Time  4    Period  Weeks        PT Long Term Goals - 09/24/18 1046      PT LONG TERM GOAL #1   Title  Pt will be able to return to gym workouts    Baseline  limited by precautions at eval    Time  6    Period  Weeks    Status  New    Target Date  11/05/18      PT LONG TERM GOAL #2   Title  Pt will be independent in long term HEP for continued care    Baseline  will progress and establish as appropriate    Time  6    Period  Weeks    Status  New    Target Date  11/05/18      PT LONG TERM GOAL #3   Title  Pt will demo proper balance statically and dynamically in single leg stance to indicate biomechanical chain control    Baseline  unable to test at eval due to precautions    Time  6    Period  Weeks    Status  New    Target Date  11/05/18            Plan - 10/15/18 0809    Clinical Impression Statement  Utilized tower for strength and balance challenges with respect to restrictions of post op knee. Limited proximal strength and control around bilateral hips leading to stress at knee joints. Cues required for form but good challenge for him.    PT Treatment/Interventions  ADLs/Self Care Home Management;Cryotherapy;Electrical Stimulation;Ultrasound;Moist Heat;Iontophoresis 4mg /ml Dexamethasone;Gait training;Stair training;Functional mobility training;Therapeutic activities;Therapeutic exercise;Balance training;Patient/family education;Neuromuscular re-education;Manual  techniques;Passive range of motion;Scar mobilization;Dry needling;Taping    PT Next Visit Plan  flx >90 OK but no loading over 90; continue tower/reformer-create HEP    PT Home Exercise Plan  supine HSS & ITB, long sitting gastroc stretch, supine hip flx+abd, sidelying hip abd mini squats , TKE squats, eccentric heel raise, single leg running balance, heel raises turn out    Consulted and Agree with Plan of Care  Patient       Patient will benefit from skilled therapeutic intervention in order to improve the following deficits and impairments:  Decreased range of motion, Difficulty walking, Increased muscle spasms, Decreased activity tolerance, Pain, Improper body mechanics, Impaired flexibility, Decreased balance, Decreased strength, Postural dysfunction  Visit Diagnosis: Acute pain of left knee  Muscle weakness (generalized)     Problem List Patient Active Problem List   Diagnosis Date Noted  . 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 Tasha Jindra PT, DPT 10/15/18 8:11 AM   [facility] [address] [place], [place], [address] Phone: [phone]   Fax:  [phone]  Name: Luis Arias MRN: [id number] Date of Birth: 21-Apr-1990

## 2018-10-19 ENCOUNTER — Other Ambulatory Visit: Payer: Self-pay

## 2018-10-19 ENCOUNTER — Encounter: Payer: Self-pay | Admitting: Physical Therapy

## 2018-10-19 ENCOUNTER — Ambulatory Visit: Payer: No Typology Code available for payment source | Admitting: Physical Therapy

## 2018-10-19 DIAGNOSIS — M25562 Pain in left knee: Secondary | ICD-10-CM

## 2018-10-19 DIAGNOSIS — M6281 Muscle weakness (generalized): Secondary | ICD-10-CM

## 2018-10-19 NOTE — Therapy (Signed)
[place], [place], [address] Phone: [phone]   Fax:  [phone]  Physical Therapy Treatment  Patient Details  Name: Luis Arias MRN: [id number] Date of Birth: November 15, 1990 Referring Provider (PT): Marlou Sa Tonna Corner, MD   Encounter Date: 10/19/2018  PT End of Session - 10/19/18 1253    Visit Number  7    Number of Visits  16    Authorization Time Period  Eval + 15 visits    Authorization - Visit Number  7    Authorization - Number of Visits  15    PT Start Time  0758    PT Stop Time  0840    PT Time Calculation (min)  42 min    Activity Tolerance  Patient tolerated treatment well    Behavior During Therapy  [facility] for tasks assessed/performed       Past Medical History:  Diagnosis Date  . Anxiety   . Arthritis    knees  . Depression   . GERD (gastroesophageal reflux disease)    controls with diet  . PTSD (post-traumatic stress disorder)    "se[facility]    Past Surgical History:  Procedure Laterality Date  . HIP ARTHROSCOPY W/ LABRAL REPAIR Left   . KNEE ARTHROSCOPY Left 08/2015  . KNEE ARTHROSCOPY Right 10/2008  . KNEE ARTHROSCOPY WITH MENISCAL REPAIR Left 08/30/2018   Procedure: LEFT KNEE ARTHROSCOPY AND DEBRIEDMENT AND REPAIR;  Surgeon: Meredith Pel, MD;  Location: [place];  Service: Orthopedics;  Laterality: Left;    There were no vitals filed for this visit.  Subjective Assessment - 10/19/18 0843    Subjective  Was doing just fine all weekend- cleaned car, visited people, drove, sat and drew cartoons. Last night when I stood up, it was very sore and swollen. I used analgesic creams and ice.    Patient Stated Goals  Trying to get strength and flexibility back.  To get to the point to where I can participate in sports.    Currently in Pain?  Yes    Pain Location  Knee    Pain Orientation  Left    Pain Descriptors / Indicators  Tightness;Aching     Aggravating Factors   bending, putting a lot of pressure    Pain Relieving Factors  elevate & ice                       [facility] Adult PT Treatment/Exercise - 10/19/18 0001      Knee/Hip Exercises: Standing   Gait Training  use of single crutch, heel-toe & full knee extension      Vasopneumatic   Number Minutes Vasopneumatic   15 minutes    Vasopnuematic Location   Knee    Vasopneumatic Pressure  Low    Vasopneumatic Temperature   32      Manual Therapy   Manual Therapy  Edema management    Edema Management  Lt knee             PT Education - 10/19/18 1253    Education Details  overuse and feeling good    Person(s) Educated  Patient    Methods  Explanation    Comprehension  Verbalized understanding       PT Short Term Goals - 10/05/18 0903      PT SHORT TERM GOAL #1   Title  gross hip strenght 5/5    Baseline  see flowsheet    Time  4    Period  Weeks        PT Long Term Goals - 09/24/18 1046      PT LONG TERM GOAL #1   Title  Pt will be able to return to gym workouts    Baseline  limited by precautions at eval    Time  6    Period  Weeks    Status  New    Target Date  11/05/18      PT LONG TERM GOAL #2   Title  Pt will be independent in long term HEP for continued care    Baseline  will progress and establish as appropriate    Time  6    Period  Weeks    Status  New    Target Date  11/05/18      PT LONG TERM GOAL #3   Title  Pt will demo proper balance statically and dynamically in single leg stance to indicate biomechanical chain control    Baseline  unable to test at eval due to precautions    Time  6    Period  Weeks    Status  New    Target Date  11/05/18            Plan - 10/19/18 0845    Clinical Impression Statement  Pt arrived using a single axillary crutch due to pain today. After discussion, we determined that there was not an inciting incident that would have caused damage, rather he just over-did it this weekend  after feeling really good on Friday. Edema management today with manual therapy and use of VASO. Pt reported feeling a little better but still very sore.    PT Treatment/Interventions  ADLs/Self Care Home Management;Cryotherapy;Electrical Stimulation;Ultrasound;Moist Heat;Iontophoresis 4mg /ml Dexamethasone;Gait training;Stair training;Functional mobility training;Therapeutic activities;Therapeutic exercise;Balance training;Patient/family education;Neuromuscular re-education;Manual techniques;Passive range of motion;Scar mobilization;Dry needling;Taping    PT Next Visit Plan  flx >90 OK but no loading over 90; continue tower/reformer-create HEP    PT Home Exercise Plan  supine HSS & ITB, long sitting gastroc stretch, supine hip flx+abd, sidelying hip abd mini squats , TKE squats, eccentric heel raise, single leg running balance, heel raises turn out    Consulted and Agree with Plan of Care  Patient       Patient will benefit from skilled therapeutic intervention in order to improve the following deficits and impairments:  Decreased range of motion, Difficulty walking, Increased muscle spasms, Decreased activity tolerance, Pain, Improper body mechanics, Impaired flexibility, Decreased balance, Decreased strength, Postural dysfunction  Visit Diagnosis: Acute pain of left knee  Muscle weakness (generalized)     Problem List Patient Active Problem List   Diagnosis Date Noted  . 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 Cionna Collantes PT, DPT 10/19/18 1:00 PM   [facility] [address] [place], [place], [address] Phone: [phone]   Fax:  [phone]  Name: Luis Arias MRN: [id number] Date of Birth: 10/15/1990

## 2018-10-22 ENCOUNTER — Encounter: Payer: No Typology Code available for payment source | Admitting: Physical Therapy

## 2018-10-22 ENCOUNTER — Other Ambulatory Visit: Payer: Self-pay

## 2018-10-22 ENCOUNTER — Encounter: Payer: Self-pay | Admitting: Physical Therapy

## 2018-10-22 ENCOUNTER — Ambulatory Visit: Payer: No Typology Code available for payment source | Admitting: Physical Therapy

## 2018-10-22 DIAGNOSIS — M6281 Muscle weakness (generalized): Secondary | ICD-10-CM

## 2018-10-22 DIAGNOSIS — M25562 Pain in left knee: Secondary | ICD-10-CM

## 2018-10-22 NOTE — Therapy (Signed)
[facility] [address] [place], [place], [address] Phone: [phone]   Fax:  [phone]  Physical Therapy Treatment  Patient Details  Name: Luis InglesKennon Charette MRN: [id number] Date of Birth: March 20, 1990 Referring Provider (PT): August Saucerean, Corrie MckusickGregory Scott, MD   Encounter Date: 10/22/2018  PT End of Session - 10/22/18 0811    Visit Number  8    Number of Visits  16    Authorization Type  [place]    Authorization Time Period  Eval + 15 visits    Authorization - Visit Number  8    Authorization - Number of Visits  15    PT Start Time  [phone]   pt arrived late   PT Stop Time  0846    PT Time Calculation (min)  36 min    Activity Tolerance  Patient tolerated treatment well    Behavior During Therapy  [facility] for tasks assessed/performed       Past Medical History:  Diagnosis Date  . Anxiety   . Arthritis    knees  . Depression   . GERD (gastroesophageal reflux disease)    controls with diet  . PTSD (post-traumatic stress disorder)    [facility] [place]    Past Surgical History:  Procedure Laterality Date  . HIP ARTHROSCOPY W/ LABRAL REPAIR Left   . KNEE ARTHROSCOPY Left 08/2015  . KNEE ARTHROSCOPY Right 10/2008  . KNEE ARTHROSCOPY WITH MENISCAL REPAIR Left 08/30/2018   Procedure: LEFT KNEE ARTHROSCOPY AND DEBRIEDMENT AND REPAIR;  Surgeon: Cammy Copaean, Gregory Scott, MD;  [facility];  Service: Orthopedics;  Laterality: Left;    There were no vitals filed for this visit.  Subjective Assessment - 10/22/18 0810    Subjective  Rested and iced for the last 3 days so it is a little better, still tight    Patient Stated Goals  Trying to get strength and flexibility back.  To get to the point to where I can participate in sports.    Currently in Pain?  Yes    Pain Score  6     Pain Location  Knee    Pain Orientation  Left    Pain Descriptors / Indicators  Tightness                       [facility] Adult PT  Treatment/Exercise - 10/22/18 0001      Knee/Hip Exercises: Stretches   Passive Hamstring Stretch Limitations  supine with strap midline & lateral    Piriformis Stretch Limitations  figure 4      Knee/Hip Exercises: Standing   Heel Raises Limitations  edge of step      Knee/Hip Exercises: Supine   Straight Leg Raises Limitations  SLR-hover-abduct-adduct-lower      Manual Therapy   Manual Therapy  Soft tissue mobilization    Edema Management  Lt ant/sup/med knee    Soft tissue mobilization  LT TFL, lateral gastroc & HS               PT Short Term Goals - 10/05/18 0903      PT SHORT TERM GOAL #1   Title  gross hip strenght 5/5    Baseline  see flowsheet    Time  4    Period  Weeks        PT Long Term Goals - 09/24/18 1046      PT LONG TERM GOAL #1   Title  Pt will be able to return to gym workouts    Baseline  limited by precautions at eval    Time  6    Period  Weeks    Status  New    Target Date  11/05/18      PT LONG TERM GOAL #2   Title  Pt will be independent in long term HEP for continued care    Baseline  will progress and establish as appropriate    Time  6    Period  Weeks    Status  New    Target Date  11/05/18      PT LONG TERM GOAL #3   Title  Pt will demo proper balance statically and dynamically in single leg stance to indicate biomechanical chain control    Baseline  unable to test at eval due to precautions    Time  6    Period  Weeks    Status  New    Target Date  11/05/18            Plan - 10/22/18 0846    Clinical Impression Statement  No crutch today. Notable tightness in lateral gastroc and hamstrings and TFL/ITB. Decreased with STM and trigger point release but would benefit from [facility] for deeper release. Asked him to continue with hip strengthening and eccentric gastroc over the weekend and continue icing knee. Edma pooling on anterior/superior-medial aspect of knee likely due to lateral tightness.    PT  Treatment/Interventions  ADLs/Self Care Home Management;Cryotherapy;Electrical Stimulation;Ultrasound;Moist Heat;Iontophoresis 4mg /ml Dexamethasone;Gait training;Stair training;Functional mobility training;Therapeutic activities;Therapeutic exercise;Balance training;Patient/family education;Neuromuscular re-education;Manual techniques;Passive range of motion;Scar mobilization;Dry needling;Taping    PT Next Visit Plan  flx >90 OK but no loading over 90; continue TPR at Lt hip, proximal/core strengthening    PT Home Exercise Plan  supine HSS & ITB, long sitting gastroc stretch, supine hip flx+abd, sidelying hip abd mini squats , TKE squats, eccentric heel raise, single leg running balance, heel raises turn out    Consulted and Agree with Plan of Care  Patient       Patient will benefit from skilled therapeutic intervention in order to improve the following deficits and impairments:  Decreased range of motion, Difficulty walking, Increased muscle spasms, Decreased activity tolerance, Pain, Improper body mechanics, Impaired flexibility, Decreased balance, Decreased strength, Postural dysfunction  Visit Diagnosis: Acute pain of left knee  Muscle weakness (generalized)     Problem List Patient Active Problem List   Diagnosis Date Noted  . 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 Avy Barlett PT, DPT 10/22/18 8:49 AM   [facility] [address] [place], [place], [address] Phone: [phone]   Fax:  [phone]  Name: Burl Tauzin MRN: [id number] Date of Birth: 1990/03/21

## 2018-10-26 ENCOUNTER — Other Ambulatory Visit: Payer: Self-pay

## 2018-10-26 ENCOUNTER — Encounter: Payer: Self-pay | Admitting: Physical Therapy

## 2018-10-26 ENCOUNTER — Ambulatory Visit: Payer: No Typology Code available for payment source | Attending: Orthopedic Surgery | Admitting: Physical Therapy

## 2018-10-26 ENCOUNTER — Encounter: Payer: No Typology Code available for payment source | Admitting: Physical Therapy

## 2018-10-26 DIAGNOSIS — M25552 Pain in left hip: Secondary | ICD-10-CM | POA: Diagnosis present

## 2018-10-26 DIAGNOSIS — M25562 Pain in left knee: Secondary | ICD-10-CM

## 2018-10-26 DIAGNOSIS — M25652 Stiffness of left hip, not elsewhere classified: Secondary | ICD-10-CM | POA: Insufficient documentation

## 2018-10-26 DIAGNOSIS — M6281 Muscle weakness (generalized): Secondary | ICD-10-CM | POA: Diagnosis present

## 2018-10-26 DIAGNOSIS — R2689 Other abnormalities of gait and mobility: Secondary | ICD-10-CM | POA: Diagnosis present

## 2018-10-26 NOTE — Therapy (Signed)
[facility] [address] [place], [place], [address] Phone: [phone]   Fax:  [phone]  Physical Therapy Treatment  Patient Details  Name: Luis InglesKennon Arias MRN: [id number] Date of Birth: [birth year] Referring Provider (PT): August Saucerean, Corrie MckusickGregory Scott, MD   Encounter Date: 10/26/2018  PT End of Session - 10/26/18 0846    Visit Number  9    Number of Visits  16    Authorization Type  [facility]    Authorization Time Period  Eval + 15 visits    Authorization - Visit Number  9    Authorization - Number of Visits  15    PT Start Time  0806    PT Stop Time  0901    PT Time Calculation (min)  55 min    Activity Tolerance  Patient tolerated treatment well    Behavior During Therapy  [facility] for tasks assessed/performed       Past Medical History:  Diagnosis Date  . Anxiety   . Arthritis    knees  . Depression   . GERD (gastroesophageal reflux disease)    controls with diet  . PTSD (post-traumatic stress disorder)    [facility] [place]    Past Surgical History:  Procedure Laterality Date  . HIP ARTHROSCOPY W/ LABRAL REPAIR Left   . KNEE ARTHROSCOPY Left 08/2015  . KNEE ARTHROSCOPY Right 10/2008  . KNEE ARTHROSCOPY WITH MENISCAL REPAIR Left 08/30/2018   Procedure: LEFT KNEE ARTHROSCOPY AND DEBRIEDMENT AND REPAIR;  Surgeon: Cammy Copaean, Gregory Scott, MD;  [facility];  Service: Orthopedics;  Laterality: Left;    There were no vitals filed for this visit.  Subjective Assessment - 10/26/18 0950    Subjective  Better than it was but still feels pain on posterior knee. Keeps pressing on tight areas of gastroc.    Patient Stated Goals  Trying to get strength and flexibility back.  To get to the point to where I can participate in sports.         [facility] Assessment - 10/26/18 0001      Assessment   Medical Diagnosis  Lt knee meniscus repair    Referring Provider (PT)  Cammy Copaean, Gregory Scott, MD    Onset Date/Surgical Date   08/30/18      ROM / Strength   AROM / PROM / Strength  Strength      Strength   Overall Strength Comments  support required for control of alignment in single leg squats      Palpation   Palpation comment  concordant pain in trigger points in lateral gastroc                   [facility] Adult PT Treatment/Exercise - 10/26/18 0001      Knee/Hip Exercises: Stretches   Gastroc Stretch Limitations  slant board      Knee/Hip Exercises: Aerobic   Tread Mill  retro stepping 4 min- mirror for [employer]      Knee/Hip Exercises: Standing   Lateral Step Up Limitations  eccentric lower- mirror for alignment VC    SLS  mini pistol squat      Vasopneumatic   Number Minutes Vasopneumatic   15 minutes    Vasopnuematic Location   Knee    Vasopneumatic Pressure  Low    Vasopneumatic Temperature   32      Manual Therapy   Manual therapy comments  skilled palpation and monitoring during TPDN  Soft tissue mobilization  Lt gastroc/soleus       Trigger Point Dry Needling - 10/26/18 0001    Consent Given?  Yes    Education Handout Provided  --   verbal educaiton   Muscles Treated Lower Quadrant  Gastrocnemius    Gastrocnemius Response  Twitch response elicited;Palpable increased muscle length           PT Education - 10/26/18 0946    Education Details  functional rationale for exercises with pressure in knee, TPDN & expected outcomes    Person(s) Educated  Patient    Methods  Explanation    Comprehension  Verbalized understanding;Need further instruction       PT Short Term Goals - 10/05/18 0903      PT SHORT TERM GOAL #1   Title  gross hip strenght 5/5    Baseline  see flowsheet    Time  4    Period  Weeks        PT Long Term Goals - 09/24/18 1046      PT LONG TERM GOAL #1   Title  Pt will be able to return to gym workouts    Baseline  limited by precautions at eval    Time  6    Period  Weeks    Status  New    Target Date  11/05/18      PT LONG TERM GOAL #2    Title  Pt will be independent in long term HEP for continued care    Baseline  will progress and establish as appropriate    Time  6    Period  Weeks    Status  New    Target Date  11/05/18      PT LONG TERM GOAL #3   Title  Pt will demo proper balance statically and dynamically in single leg stance to indicate biomechanical chain control    Baseline  unable to test at eval due to precautions    Time  6    Period  Weeks    Status  New    Target Date  11/05/18            Plan - 10/26/18 0842    Clinical Impression Statement  DN to gastroc to release trigger points in lateral belly- pt reported significant improvement and decreased tightness in walking. Tightness in proximal quads with hip extension on treadmill as large steps were requested for eccentric gastroc control. Tactile, verbal and visual cues required for single leg strengthening. Discussed that his knee will swell so to do short sets and rest when needed but these are functional movements that are required of the knee so they need to be strengthened appropriately.    PT Treatment/Interventions  ADLs/Self Care Home Management;Cryotherapy;Electrical Stimulation;Ultrasound;Moist Heat;Iontophoresis 4mg /ml Dexamethasone;Gait training;Stair training;Functional mobility training;Therapeutic activities;Therapeutic exercise;Balance training;Patient/family education;Neuromuscular re-education;Manual techniques;Passive range of motion;Scar mobilization;Dry needling;Taping    PT Next Visit Plan  flx >90 OK but no loading over 90; continue TPR at Lt hip, proximal/core strengthening, ankle stability    PT Home Exercise Plan  supine HSS & ITB, long sitting gastroc stretch, supine hip flx+abd, sidelying hip abd mini squats , TKE squats, eccentric heel raise, single leg running balance, heel raises turn out, mini pistol squat, standing hip abd/ER ball behind knee    Consulted and Agree with Plan of Care  Patient       Patient will benefit  from skilled therapeutic intervention in order to improve the  following deficits and impairments:  Decreased range of motion, Difficulty walking, Increased muscle spasms, Decreased activity tolerance, Pain, Improper body mechanics, Impaired flexibility, Decreased balance, Decreased strength, Postural dysfunction  Visit Diagnosis: Acute pain of left knee  Muscle weakness (generalized)     Problem List Patient Active Problem List   Diagnosis Date Noted  . 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 Kaiel Weide PT, DPT 10/26/18 9:55 AM   [facility] [address] [place], [place], [address] Phone: [phone]   Fax:  [phone]  Name: Doral Ventrella MRN: [id number] Date of Birth: 1990/04/21

## 2018-10-29 ENCOUNTER — Encounter: Payer: Self-pay | Admitting: Physical Therapy

## 2018-10-29 ENCOUNTER — Ambulatory Visit: Payer: No Typology Code available for payment source | Admitting: Physical Therapy

## 2018-10-29 ENCOUNTER — Other Ambulatory Visit: Payer: Self-pay

## 2018-10-29 DIAGNOSIS — M25562 Pain in left knee: Secondary | ICD-10-CM | POA: Diagnosis not present

## 2018-10-29 DIAGNOSIS — M6281 Muscle weakness (generalized): Secondary | ICD-10-CM

## 2018-10-29 NOTE — Therapy (Signed)
[facility] [address] [place], [place], [address] Phone: [phone]   Fax:  [phone]  Physical Therapy Treatment  Patient Details  Name: Angelita InglesKennon Dapolito MRN: [id number] Date of Birth: 1990-03-21 Referring Provider (PT): August Saucerean, Corrie MckusickGregory Scott, MD   Encounter Date: 10/29/2018  PT End of Session - 10/29/18 0732    Visit Number  10    Number of Visits  16    Authorization Type  [facility]    Authorization - Visit Number  10    Authorization - Number of Visits  15    PT Start Time  0727   pt arrived late   PT Stop Time  0800    PT Time Calculation (min)  33 min    Activity Tolerance  Patient tolerated treatment well    Behavior During Therapy  [facility] for tasks assessed/performed       Past Medical History:  Diagnosis Date  . Anxiety   . Arthritis    knees  . Depression   . GERD (gastroesophageal reflux disease)    controls with diet  . PTSD (post-traumatic stress disorder)    [facility] [place]    Past Surgical History:  Procedure Laterality Date  . HIP ARTHROSCOPY W/ LABRAL REPAIR Left   . KNEE ARTHROSCOPY Left 08/2015  . KNEE ARTHROSCOPY Right 10/2008  . KNEE ARTHROSCOPY WITH MENISCAL REPAIR Left 08/30/2018   Procedure: LEFT KNEE ARTHROSCOPY AND DEBRIEDMENT AND REPAIR;  Surgeon: Cammy Copaean, Gregory Scott, MD;  [facility];  Service: Orthopedics;  Laterality: Left;    There were no vitals filed for this visit.  Subjective Assessment - 10/29/18 0729    Subjective  Was sore after last visit. Swelled yesterday after working it. good day to 3/10 and bad day up to 7/10. Now I notice where all the tightness is.    Patient Stated Goals  Trying to get strength and flexibility back.  To get to the point to where I can participate in sports.    Currently in Pain?  Yes    Pain Score  5     Pain Location  Knee    Pain Orientation  Left    Pain Descriptors / Indicators  Sore         [facility] PT Assessment - 10/29/18  0001      Assessment   Medical Diagnosis  Lt knee meniscus repair    Referring Provider (PT)  August Saucerean, Corrie MckusickGregory Scott, MD    Onset Date/Surgical Date  08/30/18    Next MD Visit  9/25      Sensation   Additional Comments  WFL      Strength   Left Hip Extension  4+/5    Left Hip ABduction  5/5    Left Knee Flexion  4+/5                   [facility] Adult PT Treatment/Exercise - 10/29/18 0001      Knee/Hip Exercises: Stretches   Passive Hamstring Stretch Limitations  supine with strap midline & lateral    Hip Flexor Stretch  Left;20 seconds    Gastroc Stretch Limitations  slant board    Other Knee/Hip Stretches  IT band      Knee/Hip Exercises: Aerobic   Elliptical  5 min L5 ramp 10      Knee/Hip Exercises: Sidelying   Hip ABduction Limitations  90/90 lift with flx/ext    Other Sidelying Knee/Hip Exercises  rainbows  Other Sidelying Knee/Hip Exercises  ankle eversion      Manual Therapy   Soft tissue mobilization  Lt VL               PT Short Term Goals - 10/05/18 0903      PT SHORT TERM GOAL #1   Title  gross hip strenght 5/5    Baseline  see flowsheet    Time  4    Period  Weeks        PT Long Term Goals - 10/29/18 1015      PT LONG TERM GOAL #1   Title  Pt will be able to return to gym workouts    Baseline  not able to return to PLOF at this time    Status  On-going      PT LONG TERM GOAL #2   Title  Pt will be independent in long term HEP for continued care    Baseline  requires further progression    Status  On-going      PT LONG TERM GOAL #3   Title  Pt will demo proper balance statically and dynamically in single leg stance to indicate biomechanical chain control    Baseline  poor balance and control in SLS    Status  On-going      PT LONG TERM GOAL #4   Title  Pt will demonstrate improved hip extensor strength to at least 4/5 for decreased Trendelenburg gait pattern, improved stability with gait.    Status  Achieved             Plan - 10/29/18 0845    Clinical Impression Statement  Significant improvement in overall strength but does fatigue- MMT 5/5 prior to exercise and gross 4+/5 at end of treatment today due to fatigue. Pt will require increased strength and endurance to return to PLOF and age-appropriate activities to decrease further injury. tight fibers noted along VL and lateral gastroc as result of hip abduction weakness and ankle instability. Edema pooling in Lt knee with beginning s/s of Bakers cyst and was educated on importance of stretching but not to worry much about it.    PT Treatment/Interventions  ADLs/Self Care Home Management;Cryotherapy;Electrical Stimulation;Ultrasound;Moist Heat;Iontophoresis 4mg /ml Dexamethasone;Gait training;Stair training;Functional mobility training;Therapeutic activities;Therapeutic exercise;Balance training;Patient/family education;Neuromuscular re-education;Manual techniques;Passive range of motion;Scar mobilization;Dry needling;Taping    PT Next Visit Plan  flx >90 OK but no loading over 90;  proximal/core strengthening, ankle stability, STM PRN    PT Home Exercise Plan  supine HSS & ITB, long sitting gastroc stretch, supine hip flx+abd, sidelying hip abd mini squats , TKE squats, eccentric heel raise, single leg running balance, heel raises turn out, mini pistol squat, standing hip abd/ER ball behind knee    Consulted and Agree with Plan of Care  Patient       Patient will benefit from skilled therapeutic intervention in order to improve the following deficits and impairments:  Decreased range of motion, Difficulty walking, Increased muscle spasms, Decreased activity tolerance, Pain, Improper body mechanics, Impaired flexibility, Decreased balance, Decreased strength, Postural dysfunction  Visit Diagnosis: Acute pain of left knee  Muscle weakness (generalized)     Problem List Patient Active Problem List   Diagnosis Date Noted  . Old peripheral tear of  lateral meniscus of left knee     Brielyn Bosak C. Kristi Hyer PT, DPT 10/29/18 1:32 PM   [facility] [address] [place], [place], [address] Phone: [phone]   Fax:  [phone]  Name: Keyontay Wambach MRN: [id number] Date of Birth: [birth year]

## 2018-11-03 ENCOUNTER — Other Ambulatory Visit: Payer: Self-pay

## 2018-11-03 ENCOUNTER — Encounter: Payer: Self-pay | Admitting: Physical Therapy

## 2018-11-03 ENCOUNTER — Ambulatory Visit: Payer: No Typology Code available for payment source | Admitting: Physical Therapy

## 2018-11-03 DIAGNOSIS — M25562 Pain in left knee: Secondary | ICD-10-CM

## 2018-11-03 DIAGNOSIS — M6281 Muscle weakness (generalized): Secondary | ICD-10-CM

## 2018-11-03 NOTE — Therapy (Signed)
[facility] [address] [place], [place], [address] Phone: [phone]   Fax:  [phone]  Physical Therapy Treatment  Patient Details  Name: Luis Arias MRN: [id number] Date of Birth: 1990/12/06 Referring Provider (PT): August Saucerean, Corrie MckusickGregory Scott, MD   Encounter Date: 11/03/2018  PT End of Session - 11/03/18 0845    Visit Number  11    Number of Visits  16    Authorization Type  [facility]    Authorization Time Period  Eval + 15 visits    Authorization - Visit Number  11    Authorization - Number of Visits  15    PT Start Time  0807    PT Stop Time  0900    PT Time Calculation (min)  53 min    Activity Tolerance  Patient tolerated treatment well    Behavior During Therapy  [facility] for tasks assessed/performed       Past Medical History:  Diagnosis Date  . Anxiety   . Arthritis    knees  . Depression   . GERD (gastroesophageal reflux disease)    controls with diet  . PTSD (post-traumatic stress disorder)    [facility] [place]    Past Surgical History:  Procedure Laterality Date  . HIP ARTHROSCOPY W/ LABRAL REPAIR Left   . KNEE ARTHROSCOPY Left 08/2015  . KNEE ARTHROSCOPY Right 10/2008  . KNEE ARTHROSCOPY WITH MENISCAL REPAIR Left 08/30/2018   Procedure: LEFT KNEE ARTHROSCOPY AND DEBRIEDMENT AND REPAIR;  Surgeon: Cammy Copaean, Gregory Scott, MD;  [facility];  Service: Orthopedics;  Laterality: Left;    There were no vitals filed for this visit.  Subjective Assessment - 11/03/18 0810    Subjective  Soreness around quads and hamstrings. It feels like a recovery pain. I am able to move in the right direction easier.    Patient Stated Goals  Trying to get strength and flexibility back.  To get to the point to where I can participate in sports.    Currently in Pain?  Yes    Pain Score  5     Pain Location  Knee    Pain Orientation  Left    Pain Descriptors / Indicators  Sore                        [facility] Adult PT Treatment/Exercise - 11/03/18 0001      Knee/Hip Exercises: Stretches   Gastroc Stretch Limitations  lunge stretch      Knee/Hip Exercises: Standing   Forward Lunges  Both;10 reps   10s holds   Forward Lunges Limitations  mini lunge to pad on stool with holds    SLS  holding green plyoball, UE diagonals with knee drive    Other Standing Knee Exercises  sumo squats with holds      Vasopneumatic   Number Minutes Vasopneumatic   15 minutes    Vasopnuematic Location   Knee    Vasopneumatic Pressure  Low    Vasopneumatic Temperature   32      Manual Therapy   Soft tissue mobilization  roller post chain, TPR Lt lateral calf               PT Short Term Goals - 10/05/18 0903      PT SHORT TERM GOAL #1   Title  gross hip strenght 5/5    Baseline  see flowsheet    Time  4  Period  Weeks        PT Long Term Goals - 10/29/18 1015      PT LONG TERM GOAL #1   Title  Pt will be able to return to gym workouts    Baseline  not able to return to PLOF at this time    Status  On-going      PT LONG TERM GOAL #2   Title  Pt will be independent in long term HEP for continued care    Baseline  requires further progression    Status  On-going      PT LONG TERM GOAL #3   Title  Pt will demo proper balance statically and dynamically in single leg stance to indicate biomechanical chain control    Baseline  poor balance and control in SLS    Status  On-going      PT LONG TERM GOAL #4   Title  Pt will demonstrate improved hip extensor strength to at least 4/5 for decreased Trendelenburg gait pattern, improved stability with gait.    Status  Achieved            Plan - 11/03/18 0845    Clinical Impression Statement  lunges and squats with holds for engagement and endurance as well as reduction of movement. Limitations in single leg balance noted. Encouraged short sets to determine fatigue/pain of knee.    PT  Treatment/Interventions  ADLs/Self Care Home Management;Cryotherapy;Electrical Stimulation;Ultrasound;Moist Heat;Iontophoresis 4mg /ml Dexamethasone;Gait training;Stair training;Functional mobility training;Therapeutic activities;Therapeutic exercise;Balance training;Patient/family education;Neuromuscular re-education;Manual techniques;Passive range of motion;Scar mobilization;Dry needling;Taping    PT Next Visit Plan  flx >90 OK but no loading over 90;  proximal/core strengthening, ankle stability, STM PRN    PT Home Exercise Plan  supine HSS & ITB, long sitting gastroc stretch, supine hip flx+abd, sidelying hip abd mini squats , TKE squats, eccentric heel raise, single leg running balance, heel raises turn out, mini pistol squat, standing hip abd/ER ball behind knee    Consulted and Agree with Plan of Care  Patient       Patient will benefit from skilled therapeutic intervention in order to improve the following deficits and impairments:  Decreased range of motion, Difficulty walking, Increased muscle spasms, Decreased activity tolerance, Pain, Improper body mechanics, Impaired flexibility, Decreased balance, Decreased strength, Postural dysfunction  Visit Diagnosis: Acute pain of left knee  Muscle weakness (generalized)     Problem List Patient Active Problem List   Diagnosis Date Noted  . 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 [address] [place], [place], [address] Phone: [phone]   Fax:  [phone]  Name: Luis Arias MRN: [id number] Date of Birth: 1990/09/29

## 2018-11-05 ENCOUNTER — Other Ambulatory Visit: Payer: Self-pay

## 2018-11-05 ENCOUNTER — Encounter: Payer: Self-pay | Admitting: Physical Therapy

## 2018-11-05 ENCOUNTER — Ambulatory Visit: Payer: No Typology Code available for payment source | Admitting: Physical Therapy

## 2018-11-05 ENCOUNTER — Encounter: Payer: No Typology Code available for payment source | Admitting: Physical Therapy

## 2018-11-05 DIAGNOSIS — M25552 Pain in left hip: Secondary | ICD-10-CM

## 2018-11-05 DIAGNOSIS — M25652 Stiffness of left hip, not elsewhere classified: Secondary | ICD-10-CM

## 2018-11-05 DIAGNOSIS — M6281 Muscle weakness (generalized): Secondary | ICD-10-CM

## 2018-11-05 DIAGNOSIS — M25562 Pain in left knee: Secondary | ICD-10-CM | POA: Diagnosis not present

## 2018-11-05 DIAGNOSIS — R2689 Other abnormalities of gait and mobility: Secondary | ICD-10-CM

## 2018-11-05 NOTE — Therapy (Signed)
[facility] [address] [place], [place], [address] Phone: [phone]   Fax:  [phone]  Physical Therapy Treatment  Patient Details  Name: Luis InglesKennon Helmstetter MRN: [id number] Date of Birth: 06/20/1990 Referring Provider (PT): August Saucerean, Corrie MckusickGregory Scott, MD   Encounter Date: 11/05/2018  PT End of Session - 11/05/18 0806    Visit Number  12    Number of Visits  16    Authorization Type  VA    Authorization Time Period  Eval + 15 visits    Authorization - Visit Number  12    Authorization - Number of Visits  15    PT Start Time  0802    PT Stop Time  0846    PT Time Calculation (min)  44 min    Activity Tolerance  Patient tolerated treatment well    Behavior During Therapy  [facility] for tasks assessed/performed       Past Medical History:  Diagnosis Date  . Anxiety   . Arthritis    knees  . Depression   . GERD (gastroesophageal reflux disease)    controls with diet  . PTSD (post-traumatic stress disorder)    [facility] [place]    Past Surgical History:  Procedure Laterality Date  . HIP ARTHROSCOPY W/ LABRAL REPAIR Left   . KNEE ARTHROSCOPY Left 08/2015  . KNEE ARTHROSCOPY Right 10/2008  . KNEE ARTHROSCOPY WITH MENISCAL REPAIR Left 08/30/2018   Procedure: LEFT KNEE ARTHROSCOPY AND DEBRIEDMENT AND REPAIR;  Surgeon: Cammy Copaean, Gregory Scott, MD;  [facility];  Service: Orthopedics;  Laterality: Left;    There were no vitals filed for this visit.  Subjective Assessment - 11/05/18 0804    Subjective  Feeling pretty good. Stretched pretty well yesterday. Hamstrings are still a little tight but better.    Patient Stated Goals  Trying to get strength and flexibility back.  To get to the point to where I can participate in sports.    Currently in Pain?  Yes    Pain Score  4     Pain Location  Knee    Pain Orientation  Left    Pain Descriptors / Indicators  Sore                       [facility] Adult PT  Treatment/Exercise - 11/05/18 0001      Knee/Hip Exercises: Stretches   Gastroc Stretch Limitations  slant board      Knee/Hip Exercises: Aerobic   Elliptical  5 min L7 ramp 14      Knee/Hip Exercises: Standing   Forward Lunges Limitations  front foot on bosu, hands in TRX for support    SLS  knee drive with opp UE chop- step up onto bosu with one hand in TrX for support    Other Standing Knee Exercises  squats on bosu with hands in TrX    Other Standing Knee Exercises  single leg hip hinge on bosu with TrX support      Knee/Hip Exercises: Prone   Other Prone Exercises  plank on bosu with alt hip ext               PT Short Term Goals - 10/05/18 0903      PT SHORT TERM GOAL #1   Title  gross hip strenght 5/5    Baseline  see flowsheet    Time  4    Period  Weeks  PT Long Term Goals - 10/29/18 1015      PT LONG TERM GOAL #1   Title  Pt will be able to return to gym workouts    Baseline  not able to return to PLOF at this time    Status  On-going      PT LONG TERM GOAL #2   Title  Pt will be independent in long term HEP for continued care    Baseline  requires further progression    Status  On-going      PT LONG TERM GOAL #3   Title  Pt will demo proper balance statically and dynamically in single leg stance to indicate biomechanical chain control    Baseline  poor balance and control in SLS    Status  On-going      PT LONG TERM GOAL #4   Title  Pt will demonstrate improved hip extensor strength to at least 4/5 for decreased Trendelenburg gait pattern, improved stability with gait.    Status  Achieved            Plan - 11/05/18 0851    Clinical Impression Statement  Utilized bosu and TrX for stabilization challenges and full body control for biomechanical chain strengthening.    PT Treatment/Interventions  ADLs/Self Care Home Management;Cryotherapy;Electrical Stimulation;Ultrasound;Moist Heat;Iontophoresis 4mg /ml Dexamethasone;Gait training;Stair  training;Functional mobility training;Therapeutic activities;Therapeutic exercise;Balance training;Patient/family education;Neuromuscular re-education;Manual techniques;Passive range of motion;Scar mobilization;Dry needling;Taping    PT Next Visit Plan  flx >90 OK but no loading over 90;  proximal/core strengthening, ankle stability, STM PRN    PT Home Exercise Plan  supine HSS & ITB, long sitting gastroc stretch, supine hip flx+abd, sidelying hip abd mini squats , TKE squats, eccentric heel raise, single leg running balance, heel raises turn out, mini pistol squat, standing hip abd/ER ball behind knee; bosu exercises    Consulted and Agree with Plan of Care  Patient       Patient will benefit from skilled therapeutic intervention in order to improve the following deficits and impairments:  Decreased range of motion, Difficulty walking, Increased muscle spasms, Decreased activity tolerance, Pain, Improper body mechanics, Impaired flexibility, Decreased balance, Decreased strength, Postural dysfunction  Visit Diagnosis: Acute pain of left knee  Muscle weakness (generalized)  Stiffness of left hip, not elsewhere classified  Other abnormalities of gait and mobility  Pain in left hip     Problem List Patient Active Problem List   Diagnosis Date Noted  . 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 Tarita Deshmukh PT, DPT 11/05/18 8:58 AM   [facility] [address] [place], [place], [address] Phone: [phone]   Fax:  [phone]  Name: Luis Arias MRN: [id number] Date of Birth: 02/08/1991

## 2018-11-09 ENCOUNTER — Other Ambulatory Visit: Payer: Self-pay

## 2018-11-09 ENCOUNTER — Encounter: Payer: Self-pay | Admitting: Physical Therapy

## 2018-11-09 ENCOUNTER — Ambulatory Visit: Payer: No Typology Code available for payment source | Admitting: Physical Therapy

## 2018-11-09 DIAGNOSIS — M6281 Muscle weakness (generalized): Secondary | ICD-10-CM

## 2018-11-09 DIAGNOSIS — M25562 Pain in left knee: Secondary | ICD-10-CM

## 2018-11-09 NOTE — Therapy (Signed)
[place], [place], [address] Phone: [phone]   Fax:  [phone]  Physical Therapy Treatment  Patient Details  Name: Luis Arias MRN: [id number] Date of Birth: [birth year] Referring Provider (PT): Marlou Sa Tonna Corner, MD   Encounter Date: 11/09/2018  PT End of Session - 11/09/18 0815    Visit Number  13    Number of Visits  16    Authorization Type  [facility]    Authorization Time Period  Eval + 15 visits    Authorization - Visit Number  13    Authorization - Number of Visits  15    PT Start Time  0806   pt arrived late   PT Stop Time  0847    PT Time Calculation (min)  41 min    Activity Tolerance  Patient tolerated treatment well    Behavior During Therapy  [facility] for tasks assessed/performed       Past Medical History:  Diagnosis Date  . Anxiety   . Arthritis    knees  . Depression   . GERD (gastroesophageal reflux disease)    controls with diet  . PTSD (post-traumatic stress disorder)    "sees a specialist at [facility]    Past Surgical History:  Procedure Laterality Date  . HIP ARTHROSCOPY W/ LABRAL REPAIR Left   . KNEE ARTHROSCOPY Left 08/2015  . KNEE ARTHROSCOPY Right 10/2008  . KNEE ARTHROSCOPY WITH MENISCAL REPAIR Left 08/30/2018   Procedure: LEFT KNEE ARTHROSCOPY AND DEBRIEDMENT AND REPAIR;  Surgeon: Meredith Pel, MD;  Location: [facility];  Service: Orthopedics;  Laterality: Left;    There were no vitals filed for this visit.  Subjective Assessment - 11/09/18 0814    Subjective  A little tightness in post knee today. rode a bike over the weekend for my hips. ready to get back to the gym    Patient Stated Goals  Trying to get strength and flexibility back.  To get to the point to where I can participate in sports.    Currently in Pain?  Yes    Pain Score  4     Pain Location  Knee    Pain Orientation  Left;Posterior    Pain Descriptors / Indicators  --   stiff                       [facility] Adult PT Treatment/Exercise - 11/09/18 0001      Knee/Hip Exercises: Standing   SLS  single leg hinge to floor    Other Standing Knee Exercises  plank out TRX- double and single leg               PT Short Term Goals - 10/05/18 0903      PT SHORT TERM GOAL #1   Title  gross hip strenght 5/5    Baseline  see flowsheet    Time  4    Period  Weeks        PT Long Term Goals - 10/29/18 1015      PT LONG TERM GOAL #1   Title  Pt will be able to return to gym workouts    Baseline  not able to return to PLOF at this time    Status  On-going      PT LONG TERM GOAL #2   Title  Pt will be independent in long term HEP for continued care    Baseline  requires further progression    Status  On-going      PT LONG TERM GOAL #3   Title  Pt will demo proper balance statically and dynamically in single leg stance to indicate biomechanical chain control    Baseline  poor balance and control in SLS    Status  On-going      PT LONG TERM GOAL #4   Title  Pt will demonstrate improved hip extensor strength to at least 4/5 for decreased Trendelenburg gait pattern, improved stability with gait.    Status  Achieved            Plan - 11/09/18 0837    Clinical Impression Statement  Continued use of TRX for some stability. Tightness noted in soleus with lateral being tighter than medial. Pt plans to purchase new shoes as his arch is collapsing his current ones. Will plan to decrease to 1/week after current authorization ends in order to progress back into PLOF.    PT Treatment/Interventions  ADLs/Self Care Home Management;Cryotherapy;Electrical Stimulation;Ultrasound;Moist Heat;Iontophoresis 4mg /ml Dexamethasone;Gait training;Stair training;Functional mobility training;Therapeutic activities;Therapeutic exercise;Balance training;Patient/family education;Neuromuscular re-education;Manual techniques;Passive range of motion;Scar mobilization;Dry  needling;Taping    PT Next Visit Plan  flx >90 OK but no loading over 90 (visit with MD on 9/25)    CKC ankle stability, cues to engage hip stabilizers    PT Home Exercise Plan  supine HSS & ITB, long sitting gastroc stretch, supine hip flx+abd, sidelying hip abd mini squats , TKE squats, eccentric heel raise, single leg running balance, heel raises turn out, mini pistol squat, standing hip abd/ER ball behind knee; bosu exercises    Consulted and Agree with Plan of Care  Patient       Patient will benefit from skilled therapeutic intervention in order to improve the following deficits and impairments:  Decreased range of motion, Difficulty walking, Increased muscle spasms, Decreased activity tolerance, Pain, Improper body mechanics, Impaired flexibility, Decreased balance, Decreased strength, Postural dysfunction  Visit Diagnosis: Acute pain of left knee  Muscle weakness (generalized)     Problem List Patient Active Problem List   Diagnosis Date Noted  . 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 Andie Mungin PT, DPT 11/09/18 10:16 AM   [facility] [address] [place], [place], [address] Phone: [phone]   Fax:  [phone]  Name: Angelita InglesKennon Decook MRN: [id number] Date of Birth: 11-14-1990

## 2018-11-12 ENCOUNTER — Other Ambulatory Visit: Payer: Self-pay

## 2018-11-12 ENCOUNTER — Encounter: Payer: Self-pay | Admitting: Physical Therapy

## 2018-11-12 ENCOUNTER — Ambulatory Visit: Payer: No Typology Code available for payment source | Admitting: Physical Therapy

## 2018-11-12 ENCOUNTER — Encounter: Payer: No Typology Code available for payment source | Admitting: Physical Therapy

## 2018-11-12 DIAGNOSIS — M25652 Stiffness of left hip, not elsewhere classified: Secondary | ICD-10-CM

## 2018-11-12 DIAGNOSIS — M25552 Pain in left hip: Secondary | ICD-10-CM

## 2018-11-12 DIAGNOSIS — M25562 Pain in left knee: Secondary | ICD-10-CM

## 2018-11-12 DIAGNOSIS — M6281 Muscle weakness (generalized): Secondary | ICD-10-CM

## 2018-11-12 DIAGNOSIS — R2689 Other abnormalities of gait and mobility: Secondary | ICD-10-CM

## 2018-11-12 NOTE — Therapy (Signed)
[place], [place], [address] Phone: [phone]   Fax:  [phone]  Physical Therapy Treatment  Patient Details  Name: Luis Arias MRN: [id number] Date of Birth: 12/29/1990 Referring Provider (PT): Marlou Sa Tonna Corner, MD   Encounter Date: 11/12/2018  PT End of Session - 11/12/18 0806    Visit Number  14    Number of Visits  16    Date for PT Re-Evaluation  05/12/18    Authorization Type  VA    Authorization Time Period  Eval + 15 visits    Authorization - Visit Number  14    Authorization - Number of Visits  16    PT Start Time  0806    PT Stop Time  0845    PT Time Calculation (min)  39 min    Activity Tolerance  Patient tolerated treatment well    Behavior During Therapy  [facility] for tasks assessed/performed       Past Medical History:  Diagnosis Date  . Anxiety   . Arthritis    knees  . Depression   . GERD (gastroesophageal reflux disease)    controls with diet  . PTSD (post-traumatic stress disorder)    "se[facility] a specialist at [facility]    Past Surgical History:  Procedure Laterality Date  . HIP ARTHROSCOPY W/ LABRAL REPAIR Left   . KNEE ARTHROSCOPY Left 08/2015  . KNEE ARTHROSCOPY Right 10/2008  . KNEE ARTHROSCOPY WITH MENISCAL REPAIR Left 08/30/2018   Procedure: LEFT KNEE ARTHROSCOPY AND DEBRIEDMENT AND REPAIR;  Surgeon: Meredith Pel, MD;  Location: [place];  Service: Orthopedics;  Laterality: Left;    There were no vitals filed for this visit.  Subjective Assessment - 11/12/18 0808    Subjective  4-5/10 pain in lateral knee.  I have been the stretching and exericse that Janett Billow gave me. I will see MD on Monday.    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  To get to the point to where I can  participate in sports.    Currently in Pain?  Yes    Pain Score  5     Pain Location  Knee    Pain Orientation  Left;Posterior    Pain Descriptors / Indicators  Dull;Aching    Pain Type  Surgical pain    Pain Onset  More than a month ago    Pain Frequency  Intermittent    Aggravating Factors   strengthening it but I feel stronger                       [facility] Adult PT Treatment/Exercise - 11/12/18 0001      Knee/Hip Exercises: Stretches   Gastroc Stretch Limitations  slant board    Soleus Stretch  3 reps;30 seconds   left knee     Knee/Hip Exercises: Standing   Forward Lunges Limitations  front foot on bosu, hands in TRX for support   bil squat with UE support 20 x    SLS  single leg hinge to floor   TRX UE support   Other Standing Knee Exercises  plank out TRX- double and single leg, for hips feet in stirrups and plank UE with mountain climbers and then bil.; runner step up on flat  side of BOSU 3 x 10 x forward lunge with left foot on bosu 3 x 10 reps    R and Left side   Other Standing Knee Exercises  back against wall and bil DF(tibialis anterior strength. 15 x 3; Reverse lunge with TRX UE support R and L 15 x each      Knee/Hip Exercises: Prone   Other Prone Exercises  plank on bosu with alt hip abd,  prone hams bil with 10 lb wt             PT Education - 11/12/18 0815    Education Details  added Tibialis anterior strength/soleus stretch with pt demo,  reinforced HEP    Person(s) Educated  Patient    Methods  Explanation;Demonstration    Comprehension  Verbalized understanding;Returned demonstration       PT Short Term Goals - 10/05/18 0903      PT SHORT TERM GOAL #1   Title  gross hip strenght 5/5    Baseline  see flowsheet    Time  4    Period  Weeks        PT Long Term Goals - 10/29/18 1015      PT LONG TERM GOAL #1   Title  Pt will be able to return to gym workouts    Baseline  not able to return to PLOF at this time    Status   On-going      PT LONG TERM GOAL #2   Title  Pt will be independent in long term HEP for continued care    Baseline  requires further progression    Status  On-going      PT LONG TERM GOAL #3   Title  Pt will demo proper balance statically and dynamically in single leg stance to indicate biomechanical chain control    Baseline  poor balance and control in SLS    Status  On-going      PT LONG TERM GOAL #4   Title  Pt will demonstrate improved hip extensor strength to at least 4/5 for decreased Trendelenburg gait pattern, improved stability with gait.    Status  Achieved            Plan - 11/12/18 [id number]    Clinical Impression Statement  Pt maintained pain at 4/10 with  stability with TRX exercises. Pt ended with stretching of soleus and gastroc. Pt states he will purchase new shoes next week to correct collapsing medial arch. Pt states he continues at pain level but he does feel stronger and is able to work throughout session.  Pt ready to progress    Personal Factors and Comorbidities  Comorbidity 1    Comorbidities  labral repair, anxiety, PTSD    Examination-Activity Limitations  Locomotion Level;Sit;Sleep;Squat;Stairs;Stand    Examination-Participation Restrictions  Meal Prep    Stability/Clinical Decision Making  Stable/Uncomplicated    Rehab Potential  Good    PT Frequency  2x / week    PT Duration  6 weeks    PT Treatment/Interventions  ADLs/Self Care Home Management;Cryotherapy;Electrical Stimulation;Ultrasound;Moist Heat;Iontophoresis 4mg /ml Dexamethasone;Gait training;Stair training;Functional mobility training;Therapeutic activities;Therapeutic exercise;Balance training;Patient/family education;Neuromuscular re-education;Manual techniques;Passive range of motion;Scar mobilization;Dry needling;Taping    PT Next Visit Plan  flx >90 OK but no loading over 90 (visit with MD on 9/25)    CKC ankle stability, cues to engage hip stabilizers       Patient will benefit from skilled  therapeutic intervention in order to improve the  following deficits and impairments:  Decreased range of motion, Difficulty walking, Increased muscle spasms, Decreased activity tolerance, Pain, Improper body mechanics, Impaired flexibility, Decreased balance, Decreased strength, Postural dysfunction  Visit Diagnosis: Acute pain of left knee  Muscle weakness (generalized)  Stiffness of left hip, not elsewhere classified  Other abnormalities of gait and mobility  Pain in left hip     Problem List Patient Active Problem List   Diagnosis Date Noted  . 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 [place], [place], [address] Phone: [phone]   Fax:  [phone]  Name: Luis Arias MRN: [id number] Date of Birth: [birth year]

## 2018-11-15 ENCOUNTER — Ambulatory Visit (INDEPENDENT_AMBULATORY_CARE_PROVIDER_SITE_OTHER): Payer: No Typology Code available for payment source | Admitting: Orthopedic Surgery

## 2018-11-15 ENCOUNTER — Encounter: Payer: No Typology Code available for payment source | Admitting: Physical Therapy

## 2018-11-15 ENCOUNTER — Encounter: Payer: Self-pay | Admitting: Orthopedic Surgery

## 2018-11-15 DIAGNOSIS — S838X2D Sprain of other specified parts of left knee, subsequent encounter: Secondary | ICD-10-CM

## 2018-11-15 NOTE — Progress Notes (Signed)
   Post-Op Visit Note   Patient: Luis Arias           Date of Birth: August 05, 1990           MRN: [id number] Visit Date: 11/15/2018 PCP: Patient, No Pcp Per   Assessment & Plan:  Chief Complaint:  Chief Complaint  Patient presents with  . Left Knee - Follow-up   Visit Diagnoses:  1. Injury of meniscus of left knee, subsequent encounter     Plan: Patient is now about 7 weeks out left knee arthroscopy and lateral meniscal repair.  On exam is got mild effusion with excellent range of motion and no mechanical symptoms.  Knee aspirated today.  Get about 40 cc out plan is no knee flexion loading greater than 90 degrees with resistance work for 6 weeks.  Come back in 6 weeks for clinical recheck and likely release. Follow-Up Instructions: Return in about 6 weeks (around 12/27/2018).   Orders:  No orders of the defined types were placed in this encounter.  No orders of the defined types were placed in this encounter.   Imaging: No results found.  PMFS History: Patient Active Problem List   Diagnosis Date Noted  . 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 Anxiety   . Arthritis    knees  . Depression   . GERD (gastroesophageal reflux disease)    controls with diet  . PTSD (post-traumatic stress disorder)    "sees a specialist at [facility]    History reviewed. No pertinent family history.  Past Surgical History:  Procedure Laterality Date  . HIP ARTHROSCOPY W/ LABRAL REPAIR Left   . KNEE ARTHROSCOPY Left 08/2015  . KNEE ARTHROSCOPY Right 10/2008  . KNEE ARTHROSCOPY WITH MENISCAL REPAIR Left 08/30/2018   Procedure: LEFT KNEE ARTHROSCOPY AND DEBRIEDMENT AND REPAIR;  Surgeon: Meredith Pel, MD;  Location: [place];  Service: Orthopedics;  Laterality: Left;   Social History   Occupational History  . Not on file  Tobacco Use  . Smoking status: Current Some Day Smoker    Years: 12.00    Types: Cigarettes  .  Smokeless tobacco: Current User    Types: Chew  . Tobacco comment: < 1 cig a day  Substance and Sexual Activity  . Alcohol use: Not Currently  . Drug use: Not Currently    Frequency: 7.0 times per week    Types: Marijuana  . Sexual activity: Not on file

## 2018-11-16 ENCOUNTER — Encounter: Payer: Self-pay | Admitting: Physical Therapy

## 2018-11-16 ENCOUNTER — Other Ambulatory Visit: Payer: Self-pay

## 2018-11-16 ENCOUNTER — Ambulatory Visit: Payer: No Typology Code available for payment source | Admitting: Physical Therapy

## 2018-11-16 DIAGNOSIS — M25562 Pain in left knee: Secondary | ICD-10-CM

## 2018-11-16 DIAGNOSIS — M6281 Muscle weakness (generalized): Secondary | ICD-10-CM

## 2018-11-16 NOTE — Therapy (Signed)
[place], [place], [address] Phone: [phone]   Fax:  [phone]  Physical Therapy Treatment  Patient Details  Name: Luis Arias MRN: [id number] Date of Birth: 23-Nov-1990 Referring Provider (PT): Marlou Sa Tonna Corner, MD   Encounter Date: 11/16/2018  PT End of Session - 11/16/18 0810    Visit Number  15    Number of Visits  16    Authorization Type  VA    Authorization Time Period  Eval + 15 visits    Authorization - Visit Number  15    Authorization - Number of Visits  16    PT Start Time  0808    PT Stop Time  0844    PT Time Calculation (min)  36 min    Activity Tolerance  Patient tolerated treatment well    Behavior During Therapy  [facility] for tasks assessed/performed       Past Medical History:  Diagnosis Date  . Anxiety   . Arthritis    knees  . Depression   . GERD (gastroesophageal reflux disease)    controls with diet  . PTSD (post-traumatic stress disorder)    "sees a specialist at [facility]    Past Surgical History:  Procedure Laterality Date  . HIP ARTHROSCOPY W/ LABRAL REPAIR Left   . KNEE ARTHROSCOPY Left 08/2015  . KNEE ARTHROSCOPY Right 10/2008  . KNEE ARTHROSCOPY WITH MENISCAL REPAIR Left 08/30/2018   Procedure: LEFT KNEE ARTHROSCOPY AND DEBRIEDMENT AND REPAIR;  Surgeon: Meredith Pel, MD;  Location: [place];  Service: Orthopedics;  Laterality: Left;    There were no vitals filed for this visit.  Subjective Assessment - 11/16/18 0817    Subjective  In good spirits, starts back to work on Friday.    Patient Stated Goals  Trying to get strength and flexibility back.  To get to the point to where I can participate in sports.    Currently in Pain?  Yes    Pain Score  6     Pain Location  Knee    Pain Orientation  Left    Pain Descriptors / Indicators  Aching         [facility] PT Assessment - 11/16/18 0001      Assessment   Medical Diagnosis  Lt knee meniscus  repair    Referring Provider (PT)  Marlou Sa Tonna Corner, MD    Onset Date/Surgical Date  08/30/18                   [facility] Adult PT Treatment/Exercise - 11/16/18 0001      Knee/Hip Exercises: Stretches   Gastroc Stretch  2 reps;30 seconds;Both    Gastroc Stretch Limitations  slant board      Knee/Hip Exercises: Machines for Strengthening   Total Gym Leg Press  supine leg press 2 plates      Knee/Hip Exercises: Supine   Straight Leg Raises Limitations  SLR in supine plank- on hands and elbows      Knee/Hip Exercises: Prone   Other Prone Exercises  primal push ups with clams               PT Short Term Goals - 10/05/18 0903      PT SHORT TERM GOAL #1   Title  gross hip strenght 5/5    Baseline  see flowsheet    Time  4    Period  Weeks  PT Long Term Goals - 11/16/18 0840      PT LONG TERM GOAL #5   Title  plyometrics            Plan - 11/16/18 0846    Clinical Impression Statement  Continued with encouraging ankle stability. Added leg press with cues for form and weight progression. Began 2-foot plyometrics, notable lack of ankle control placing knee and hip at risk. Asked him to begin transition back to gym program but still has limitations- cannot load knee when flexed greater than 90 deg. Due to these precautions, I cannot say that he is at baseline and would benefit from continuing with PT 1/week until all restrictions are lifted to ensure that he is safe and not at risk for further injury.    PT Treatment/Interventions  ADLs/Self Care Home Management;Cryotherapy;Electrical Stimulation;Ultrasound;Moist Heat;Iontophoresis 4mg /ml Dexamethasone;Gait training;Stair training;Functional mobility training;Therapeutic activities;Therapeutic exercise;Balance training;Patient/family education;Neuromuscular re-education;Manual techniques;Passive range of motion;Scar mobilization;Dry needling;Taping    PT Next Visit Plan  flx >90 OK but no loading over 90  (6 more weeks after MD on 9/25)    ankle stability, how was independent gym? continue plyometrics    PT Home Exercise Plan  supine HSS & ITB, long sitting gastroc stretch, supine hip flx+abd, sidelying hip abd mini squats , TKE squats, eccentric heel raise, single leg running balance, heel raises turn out, mini pistol squat, standing hip abd/ER ball behind knee; bosu exercises, primal push ups, double leg hops    Consulted and Agree with Plan of Care  Patient       Patient will benefit from skilled therapeutic intervention in order to improve the following deficits and impairments:  Decreased range of motion, Difficulty walking, Increased muscle spasms, Decreased activity tolerance, Pain, Improper body mechanics, Impaired flexibility, Decreased balance, Decreased strength, Postural dysfunction  Visit Diagnosis: Acute pain of left knee  Muscle weakness (generalized)     Problem List Patient Active Problem List   Diagnosis Date Noted  . 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 Betzayda Braxton PT, DPT 11/16/18 1:08 PM   [facility] [address] [place], [place], [place] Phone: [phone]   Fax:  [phone]  Name: Luis Arias MRN: [name] Date of Birth: 1990-07-25

## 2018-11-19 ENCOUNTER — Ambulatory Visit: Payer: No Typology Code available for payment source | Admitting: Physical Therapy

## 2018-11-22 ENCOUNTER — Encounter: Payer: No Typology Code available for payment source | Admitting: Physical Therapy

## 2018-11-24 ENCOUNTER — Encounter: Payer: No Typology Code available for payment source | Admitting: Physical Therapy

## 2018-11-26 ENCOUNTER — Ambulatory Visit: Payer: No Typology Code available for payment source | Attending: Surgical | Admitting: Physical Therapy

## 2018-11-26 ENCOUNTER — Encounter: Payer: Self-pay | Admitting: Physical Therapy

## 2018-11-26 ENCOUNTER — Other Ambulatory Visit: Payer: Self-pay

## 2018-11-26 DIAGNOSIS — M25552 Pain in left hip: Secondary | ICD-10-CM | POA: Diagnosis present

## 2018-11-26 DIAGNOSIS — M6281 Muscle weakness (generalized): Secondary | ICD-10-CM | POA: Diagnosis present

## 2018-11-26 DIAGNOSIS — M25562 Pain in left knee: Secondary | ICD-10-CM | POA: Insufficient documentation

## 2018-11-26 DIAGNOSIS — R2689 Other abnormalities of gait and mobility: Secondary | ICD-10-CM | POA: Insufficient documentation

## 2018-11-26 DIAGNOSIS — M25652 Stiffness of left hip, not elsewhere classified: Secondary | ICD-10-CM | POA: Diagnosis present

## 2018-11-26 NOTE — Therapy (Signed)
[place], [place], [address] Phone: [phone]   Fax:  [phone]  Physical Therapy Treatment  Patient Details  Name: Luis Arias MRN: [id number] Date of Birth: [birth year] Referring Provider (PT): Marlou Sa Tonna Corner, MD   Encounter Date: 11/26/2018  PT End of Session - 11/26/18 1242    Visit Number  16    Authorization Type  VA    Authorization Time Period  Eval + 15 visits, 10/2 15 more visits 9/24-1/23    Authorization - Visit Number  1    Authorization - Number of Visits  15    PT Start Time  8101    PT Stop Time  1317    PT Time Calculation (min)  43 min    Activity Tolerance  Patient tolerated treatment well    Behavior During Therapy  [facility] for tasks assessed/performed       Past Medical History:  Diagnosis Date  . Anxiety   . Arthritis    knees  . Depression   . GERD (gastroesophageal reflux disease)    controls with diet  . PTSD (post-traumatic stress disorder)    "sees a specialist at [facility]    Past Surgical History:  Procedure Laterality Date  . HIP ARTHROSCOPY W/ LABRAL REPAIR Left   . KNEE ARTHROSCOPY Left 08/2015  . KNEE ARTHROSCOPY Right 10/2008  . KNEE ARTHROSCOPY WITH MENISCAL REPAIR Left 08/30/2018   Procedure: LEFT KNEE ARTHROSCOPY AND DEBRIEDMENT AND REPAIR;  Surgeon: Meredith Pel, MD;  Location: [place];  Service: Orthopedics;  Laterality: Left;    There were no vitals filed for this visit.  Subjective Assessment - 11/26/18 2123    Subjective  Feels good to get back to the gym. Tightness in soleus.    Currently in Pain?  No/denies         [facility] PT Assessment - 11/26/18 0001      Assessment   Medical Diagnosis  Lt knee meniscus repair    Referring Provider (PT)  Marlou Sa Tonna Corner, MD                   [facility] Adult PT Treatment/Exercise - 11/26/18 0001      Knee/Hip Exercises: Stretches   Passive Hamstring Stretch Limitations   seated EOB    Gastroc Stretch Limitations  slant board      Knee/Hip Exercises: Plyometrics   Unilateral Jumping Limitations  in place & moiving fwd      Knee/Hip Exercises: Standing   SLS  ABCs in SL mini squat    Rebounder  in SLS with mini squat      Knee/Hip Exercises: Prone   Other Prone Exercises  plank on bosu with slider abd+flx      Manual Therapy   Soft tissue mobilization  roller gastroc/soleus             PT Education - 11/26/18 2126    Education Details  breaking down exercises for progression    Person(s) Educated  Patient    Methods  Explanation    Comprehension  Verbalized understanding       PT Short Term Goals - 10/05/18 0903      PT SHORT TERM GOAL #1   Title  gross hip strenght 5/5    Baseline  see flowsheet    Time  4    Period  Weeks        PT Long Term Goals - 11/16/18 7510  PT LONG TERM GOAL #5   Title  plyometrics            Plan - 11/26/18 2127    Clinical Impression Statement  Continued instability in bilateral ankles in balance and plyometric activities. Cues provided for abdominal engagement for proximal support.    PT Treatment/Interventions  ADLs/Self Care Home Management;Cryotherapy;Electrical Stimulation;Ultrasound;Moist Heat;Iontophoresis 4mg /ml Dexamethasone;Gait training;Stair training;Functional mobility training;Therapeutic activities;Therapeutic exercise;Balance training;Patient/family education;Neuromuscular re-education;Manual techniques;Passive range of motion;Scar mobilization;Dry needling;Taping    PT Next Visit Plan  flx >90 OK but no loading over 90 (6 more weeks after MD on 9/25)    ankle stability, continue plyometrics    PT Home Exercise Plan  supine HSS & ITB, long sitting gastroc stretch, supine hip flx+abd, sidelying hip abd mini squats , TKE squats, eccentric heel raise, single leg running balance, heel raises turn out, mini pistol squat, standing hip abd/ER ball behind knee; bosu exercises, primal  push ups, double leg hops    Consulted and Agree with Plan of Care  Patient       Patient will benefit from skilled therapeutic intervention in order to improve the following deficits and impairments:  Decreased range of motion, Difficulty walking, Increased muscle spasms, Decreased activity tolerance, Pain, Improper body mechanics, Impaired flexibility, Decreased balance, Decreased strength, Postural dysfunction  Visit Diagnosis: Acute pain of left knee  Muscle weakness (generalized)     Problem List Patient Active Problem List   Diagnosis Date Noted  . 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 Barbette Mcglaun PT, DPT 11/26/18 9:30 PM   [facility] [address] [place], [place], [place] Phone: [phone]   Fax:  [phone]  Name: Luis Arias MRN: [name] Date of Birth: 11-08-1990

## 2018-12-03 ENCOUNTER — Other Ambulatory Visit: Payer: Self-pay

## 2018-12-03 ENCOUNTER — Ambulatory Visit: Payer: No Typology Code available for payment source | Admitting: Physical Therapy

## 2018-12-03 ENCOUNTER — Encounter: Payer: Self-pay | Admitting: Physical Therapy

## 2018-12-03 DIAGNOSIS — M25562 Pain in left knee: Secondary | ICD-10-CM

## 2018-12-03 DIAGNOSIS — M6281 Muscle weakness (generalized): Secondary | ICD-10-CM

## 2018-12-03 NOTE — Therapy (Signed)
[place], [place], [address] Phone: [phone]   Fax:  [phone]  Physical Therapy Treatment  Patient Details  Name: Luis Arias MRN: [id number] Date of Birth: 08-31-1990 Referring Provider (PT): Marlou Sa Tonna Corner, MD   Encounter Date: 12/03/2018  PT End of Session - 12/03/18 0808    Visit Number  17    Authorization Time Period  Eval + 15 visits, 10/2 15 more visits 9/24-1/23    Authorization - Visit Number  2    Authorization - Number of Visits  15    PT Start Time  0804    PT Stop Time  0845    PT Time Calculation (min)  41 min    Activity Tolerance  Patient tolerated treatment well    Behavior During Therapy  [facility] for tasks assessed/performed       Past Medical History:  Diagnosis Date  . Anxiety   . Arthritis    knees  . Depression   . GERD (gastroesophageal reflux disease)    controls with diet  . PTSD (post-traumatic stress disorder)    "sees a specialist at [facility]    Past Surgical History:  Procedure Laterality Date  . HIP ARTHROSCOPY W/ LABRAL REPAIR Left   . KNEE ARTHROSCOPY Left 08/2015  . KNEE ARTHROSCOPY Right 10/2008  . KNEE ARTHROSCOPY WITH MENISCAL REPAIR Left 08/30/2018   Procedure: LEFT KNEE ARTHROSCOPY AND DEBRIEDMENT AND REPAIR;  Surgeon: Meredith Pel, MD;  Location: [place];  Service: Orthopedics;  Laterality: Left;    There were no vitals filed for this visit.  Subjective Assessment - 12/03/18 0806    Subjective  Feeling stiff. Tension in Lt calf. Knee is not locking up but uncomfortable on lateral knee.    Patient Stated Goals  Trying to get strength and flexibility back.  To get to the point to where I can participate in sports.    Currently in Pain?  Yes    Pain Score  5     Pain Location  Knee    Pain Orientation  Left    Pain Descriptors / Indicators  --   stiff   Aggravating Factors   overdoing it    Pain Relieving Factors  gentle  mobility                       [facility] Adult PT Treatment/Exercise - 12/03/18 0001      Knee/Hip Exercises: Stretches   Gastroc Stretch Limitations  slant board      Knee/Hip Exercises: Aerobic   Tread Mill  retro stepping 4 min      Knee/Hip Exercises: Standing   Heel Raises Limitations  eccentric single leg edge of step      Knee/Hip Exercises: Supine   Bridges Limitations  alteranating knee ext     Other Supine Knee/Hip Exercises  bridge hip lowers with single leg extended    Other Supine Knee/Hip Exercises  high bridge with triceps & hip dips-single leg extended      Knee/Hip Exercises: Sidelying   Hip ABduction Limitations  side plank with hip circles      Knee/Hip Exercises: Prone   Other Prone Exercises  prone superman      Manual Therapy   Manual therapy comments  skilled palpation and monitoring during TPDN    Soft tissue mobilization  gastroc/soleus       Trigger Point Dry Needling - 12/03/18 0001  Muscles Treated Lower Quadrant  Gastrocnemius;Soleus    Gastrocnemius Response  Twitch response elicited;Palpable increased muscle length   Left   Soleus Response  Twitch response elicited;Palpable increased muscle length   Left            PT Short Term Goals - 10/05/18 0903      PT SHORT TERM GOAL #1   Title  gross hip strenght 5/5    Baseline  see flowsheet    Time  4    Period  Weeks        PT Long Term Goals - 11/16/18 0840      PT LONG TERM GOAL #5   Title  plyometrics            Plan - 12/03/18 0902    Clinical Impression Statement  DN to gastroc and soleus created soreness as expected but decreased concordant pain. utilized table exercise as strengthening opportunities and discussed use of this on days when he is feeling extra stiff or sore. Pt is balancing pain and mental health and I encouraged him to thinnk about his body as a whole, it is hard work but take time to slow down when able. I do think he would benefit  from transition to [employer] for [place] education as long term strength, balance and coordination training.    PT Treatment/Interventions  ADLs/Self Care Home Management;Cryotherapy;Electrical Stimulation;Ultrasound;Moist Heat;Iontophoresis 35m/ml Dexamethasone;Gait training;Stair training;Functional mobility training;Therapeutic activities;Therapeutic exercise;Balance training;Patient/family education;Neuromuscular re-education;Manual techniques;Passive range of motion;Scar mobilization;Dry needling;Taping    PT Next Visit Plan  flx >90 OK but no loading over 90 (6 more weeks after MD on 9/25)    Tai Chi when able, continue STM on gastroc, ankle stability    PT Home Exercise Plan  supine HSS & ITB, long sitting gastroc stretch, supine hip flx+abd, sidelying hip abd mini squats , TKE squats, eccentric heel raise, single leg running balance, heel raises turn out, mini pistol squat, standing hip abd/ER ball behind knee; bosu exercises, primal push ups, double leg hops    Consulted and Agree with Plan of Care  Patient       Patient will benefit from skilled therapeutic intervention in order to improve the following deficits and impairments:  Decreased range of motion, Difficulty walking, Increased muscle spasms, Decreased activity tolerance, Pain, Improper body mechanics, Impaired flexibility, Decreased balance, Decreased strength, Postural dysfunction  Visit Diagnosis: Acute pain of left knee  Muscle weakness (generalized)     Problem List Patient Active Problem List   Diagnosis Date Noted  . 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 Preciliano Castell PT, DPT 12/03/18 9:07 AM   [facility] [address]Phone: [phone]  Fax:  [phone] Name: KMubarak BevensMRN: [id number] of Birth: [birth year]

## 2018-12-07 ENCOUNTER — Ambulatory Visit: Payer: No Typology Code available for payment source | Admitting: Physical Therapy

## 2018-12-07 ENCOUNTER — Other Ambulatory Visit: Payer: Self-pay

## 2018-12-07 ENCOUNTER — Encounter: Payer: Self-pay | Admitting: Physical Therapy

## 2018-12-07 DIAGNOSIS — M25562 Pain in left knee: Secondary | ICD-10-CM

## 2018-12-07 DIAGNOSIS — M25652 Stiffness of left hip, not elsewhere classified: Secondary | ICD-10-CM

## 2018-12-07 DIAGNOSIS — M25552 Pain in left hip: Secondary | ICD-10-CM

## 2018-12-07 DIAGNOSIS — M6281 Muscle weakness (generalized): Secondary | ICD-10-CM

## 2018-12-07 DIAGNOSIS — R2689 Other abnormalities of gait and mobility: Secondary | ICD-10-CM

## 2018-12-07 NOTE — Therapy (Signed)
[place], [place], [address] Phone: [phone]   Fax:  [phone]  Physical Therapy Treatment  Patient Details  Name: Luis Arias MRN: [id number] Date of Birth: 07-27-1990 Referring Provider (PT): Marlou Sa Tonna Corner, MD   Encounter Date: 12/07/2018  PT End of Session - 12/07/18 1131    Visit Number  18    Number of Visits  31    Date for PT Re-Evaluation  01/26/19    Authorization Type  [facility]    Authorization Time Period  Eval + 15 visits, 10/2 15 more visits 9/24-1/23    Authorization - Visit Number  3    Authorization - Number of Visits  15    PT Start Time  1022    PT Stop Time  1113    PT Time Calculation (min)  51 min    Activity Tolerance  Patient tolerated treatment well    Behavior During Therapy  [facility] for tasks assessed/performed       Past Medical History:  Diagnosis Date  . Anxiety   . Arthritis    knees  . Depression   . GERD (gastroesophageal reflux disease)    controls with diet  . PTSD (post-traumatic stress disorder)    "sees a specialist at [facility]    Past Surgical History:  Procedure Laterality Date  . HIP ARTHROSCOPY W/ LABRAL REPAIR Left   . KNEE ARTHROSCOPY Left 08/2015  . KNEE ARTHROSCOPY Right 10/2008  . KNEE ARTHROSCOPY WITH MENISCAL REPAIR Left 08/30/2018   Procedure: LEFT KNEE ARTHROSCOPY AND DEBRIEDMENT AND REPAIR;  Surgeon: Meredith Pel, MD;  Location: [place];  Service: Orthopedics;  Laterality: Left;    There were no vitals filed for this visit.  Subjective Assessment - 12/07/18 1123    Subjective  Pt arriving today 5 minutes late for therapy reporting he was at the gym. Pt reporting tightness and mild discomfort during jumping on his left lateral lower leg.    Pertinent History  knee surgery R meniscal tear 2016; L knee meniscal tear surgery July 2017; L hip acetabular tear s/p arthroscopic surgery 12/23/17    Patient Stated Goals  Trying to  get strength and flexibility back.  To get to the point to where I can participate in sports.    Currently in Pain?  Yes    Pain Score  4     Pain Location  Leg    Pain Orientation  Left;Lower    Pain Descriptors / Indicators  Sore    Pain Type  Acute pain    Pain Onset  More than a month ago                       [facility] Adult PT Treatment/Exercise - 12/07/18 0001      Knee/Hip Exercises: Stretches   Gastroc Stretch Limitations  slant board      Knee/Hip Exercises: Aerobic   Tread Mill  retro stepping 4 min      Knee/Hip Exercises: Standing   Heel Raises Limitations  eccentric single leg edge of step      Knee/Hip Exercises: Supine   Bridges Limitations  alteranating knee ext     Other Supine Knee/Hip Exercises  bridge hip lowers with single leg extended    Other Supine Knee/Hip Exercises  bridge with calves on the ball x 10 holding  5 seconds, single leg bridge with calf on the ball holding 3 seconds x 5  on each LE, progressing with arms flexed at 90 degrees to decrease UE support      Knee/Hip Exercises: Sidelying   Hip ABduction Limitations  side plank with hip circles    Other Sidelying Knee/Hip Exercises  side plank with arm extension overhead      Knee/Hip Exercises: Prone   Other Prone Exercises  prone superman alternating  x opposite arm and leg holding 5 seconds each x 10 reps      Manual Therapy   Manual Therapy  --   20 minutes   Manual therapy comments  IASTM to left lateral anterior tib, peroneals, gstrock/soleus     Soft tissue mobilization  STW performed with heel cord stretch             PT Education - 12/07/18 1131    Education Details  new bridging exercises and edu on positioning and core activation    Person(s) Educated  Patient    Methods  Explanation;Demonstration    Comprehension  Verbalized understanding;Returned demonstration       PT Short Term Goals - 12/07/18 1133      PT SHORT TERM GOAL #1   Title  gross hip  strenght 5/5    Baseline  see flowsheet    Time  4    Period  Weeks    Status  On-going      PT SHORT TERM GOAL #2   Title  .    Baseline  --    Time  --    Period  --    Status  --    Target Date  --      PT SHORT TERM GOAL #3   Title  --    Baseline  --    Time  --    Period  --    Target Date  --        PT Long Term Goals - 12/07/18 1134      PT LONG TERM GOAL #1   Title  Pt will be able to return to gym workouts    Baseline  going to gym but not at 100% currently    Time  6    Period  Weeks    Status  On-going      PT LONG TERM GOAL #2   Title  Pt will be independent in long term HEP for continued care    Baseline  requires further progression    Time  6    Period  Weeks    Status  On-going      PT LONG TERM GOAL #3   Title  Pt will demo proper balance statically and dynamically in single leg stance to indicate biomechanical chain control    Baseline  poor balance and control in SLS    Period  Weeks    Status  On-going      PT LONG TERM GOAL #4   Title  Pt will demonstrate improved hip extensor strength to at least 4/5 for decreased Trendelenburg gait pattern, improved stability with gait.    Period  Weeks    Status  Achieved      PT LONG TERM GOAL #5   Title  plyometrics            Plan - 12/07/18 1137    Clinical Impression Statement  Pt reporting good response to DN at last visit. We discussed DN again at future appointment. Pt tolerating all exerises well progressing hip and knee  strengthening. IASTM performed to L lateral anterior tib, peroneals, calf and achilles tendon. Pt reporting at end of session, pain was 1/10. "I can tell it's there, but it feels much better". Continue skilled PT to progress toward LTG's set.    Personal Factors and Comorbidities  Comorbidity 1    Comorbidities  labral repair, anxiety, PTSD    Examination-Activity Limitations  Locomotion Level;Sit;Sleep;Squat;Stairs;Stand    Examination-Participation Restrictions   Meal Prep    Stability/Clinical Decision Making  Stable/Uncomplicated    Rehab Potential  Good    PT Frequency  2x / week    PT Duration  6 weeks    PT Treatment/Interventions  ADLs/Self Care Home Management;Cryotherapy;Electrical Stimulation;Ultrasound;Moist Heat;Iontophoresis 76m/ml Dexamethasone;Gait training;Stair training;Functional mobility training;Therapeutic activities;Therapeutic exercise;Balance training;Patient/family education;Neuromuscular re-education;Manual techniques;Passive range of motion;Scar mobilization;Dry needling;Taping    PT Next Visit Plan  flx >90 OK but no loading over 90 (6 more weeks after MD on 9/25)    Tai Chi when able, continue STM on gastroc, ankle stability    PT Home Exercise Plan  supine HSS & ITB, long sitting gastroc stretch, supine hip flx+abd, sidelying hip abd mini squats , TKE squats, eccentric heel raise, single leg running balance, heel raises turn out, mini pistol squat, standing hip abd/ER ball behind knee; bosu exercises, primal push ups, double leg hops    Consulted and Agree with Plan of Care  Patient       Patient will benefit from skilled therapeutic intervention in order to improve the following deficits and impairments:  Decreased range of motion, Difficulty walking, Increased muscle spasms, Decreased activity tolerance, Pain, Improper body mechanics, Impaired flexibility, Decreased balance, Decreased strength, Postural dysfunction  Visit Diagnosis: Acute pain of left knee  Stiffness of left hip, not elsewhere classified  Muscle weakness (generalized)  Pain in left hip  Other abnormalities of gait and mobility     Problem List Patient Active Problem List   Diagnosis Date Noted  . 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

## 2018-12-14 ENCOUNTER — Encounter: Payer: Self-pay | Admitting: Physical Therapy

## 2018-12-14 ENCOUNTER — Ambulatory Visit: Payer: No Typology Code available for payment source | Admitting: Physical Therapy

## 2018-12-14 ENCOUNTER — Other Ambulatory Visit: Payer: Self-pay

## 2018-12-14 DIAGNOSIS — M25562 Pain in left knee: Secondary | ICD-10-CM

## 2018-12-14 DIAGNOSIS — M6281 Muscle weakness (generalized): Secondary | ICD-10-CM

## 2018-12-14 NOTE — Therapy (Signed)
[place], [place], [address] Phone: [phone]   Fax:  [phone]  Physical Therapy Treatment  Patient Details  Name: Luis Arias MRN: [id number] Date of Birth: Jul 06, 1990 Referring Provider (PT): Marlou Sa Tonna Corner, MD   Encounter Date: 12/14/2018  PT End of Session - 12/14/18 0811    Visit Number  19    Number of Visits  31    Date for PT Re-Evaluation  01/26/19    Authorization Type  VA    Authorization Time Period  Eval + 15 visits, 10/2 15 more visits 9/24-1/23    Authorization - Visit Number  4    Authorization - Number of Visits  15    PT Start Time  0808   pt arrived late   PT Stop Time  0855    PT Time Calculation (min)  47 min    Activity Tolerance  Patient tolerated treatment well    Behavior During Therapy  [facility] for tasks assessed/performed       Past Medical History:  Diagnosis Date  . Anxiety   . Arthritis    knees  . Depression   . GERD (gastroesophageal reflux disease)    controls with diet  . PTSD (post-traumatic stress disorder)    "sees a specialist at [facility]    Past Surgical History:  Procedure Laterality Date  . HIP ARTHROSCOPY W/ LABRAL REPAIR Left   . KNEE ARTHROSCOPY Left 08/2015  . KNEE ARTHROSCOPY Right 10/2008  . KNEE ARTHROSCOPY WITH MENISCAL REPAIR Left 08/30/2018   Procedure: LEFT KNEE ARTHROSCOPY AND DEBRIEDMENT AND REPAIR;  Surgeon: Meredith Pel, MD;  Location: [place];  Service: Orthopedics;  Laterality: Left;    There were no vitals filed for this visit.  Subjective Assessment - 12/14/18 0809    Subjective  I have been keeping myself active. Jen did some soft tissue on my shin which helped. Less locking up in my foot. Trying to keep my hips open. there is still weakness but I can tell there is strength.    Patient Stated Goals  Trying to get strength and flexibility back.  To get to the point to where I can participate in sports.    Currently in Pain?  No/denies                       [facility] Adult PT Treatment/Exercise - 12/14/18 0001      Knee/Hip Exercises: Stretches   Passive Hamstring Stretch Limitations  standing    Quad Stretch Limitations  standing, grab foot    Gastroc Stretch Limitations  slant board      Knee/Hip Exercises: Aerobic   Elliptical  6 min L1 ramp 10      Knee/Hip Exercises: Plyometrics   Other Plyometric Exercises  single leg laterals    Other Plyometric Exercises  double leg fwd & back      Knee/Hip Exercises: Standing   SLS  ABCs in SL mini squat    Other Standing Knee Exercises  pull of chair with march    Other Standing Knee Exercises  bosu lunges- hard side foot stays, press aways on blue side      Cryotherapy   Number Minutes Cryotherapy  10 Minutes   3 min with education   Cryotherapy Location  Knee    Type of Cryotherapy  Ice pack             PT Education - 12/14/18  0850    Education Details  exercise form/rationale & progressions, POC    Person(s) Educated  Patient    Methods  Explanation;Demonstration;Tactile cues;Verbal cues    Comprehension  Verbalized understanding;Returned demonstration;Verbal cues required;Tactile cues required;Need further instruction       PT Short Term Goals - 12/07/18 1133      PT SHORT TERM GOAL #1   Title  gross hip strenght 5/5    Baseline  see flowsheet    Time  4    Period  Weeks    Status  On-going      PT SHORT TERM GOAL #2   Title  .    Baseline  --    Time  --    Period  --    Status  --    Target Date  --      PT SHORT TERM GOAL #3   Title  --    Baseline  --    Time  --    Period  --    Target Date  --        PT Long Term Goals - 12/07/18 1134      PT LONG TERM GOAL #1   Title  Pt will be able to return to gym workouts    Baseline  going to gym but not at 100% currently    Time  6    Period  Weeks    Status  On-going      PT LONG TERM GOAL #2   Title  Pt will be independent in long  term HEP for continued care    Baseline  requires further progression    Time  6    Period  Weeks    Status  On-going      PT LONG TERM GOAL #3   Title  Pt will demo proper balance statically and dynamically in single leg stance to indicate biomechanical chain control    Baseline  poor balance and control in SLS    Period  Weeks    Status  On-going      PT LONG TERM GOAL #4   Title  Pt will demonstrate improved hip extensor strength to at least 4/5 for decreased Trendelenburg gait pattern, improved stability with gait.    Period  Weeks    Status  Achieved      PT LONG TERM GOAL #5   Title  plyometrics            Plan - 12/14/18 0848    Clinical Impression Statement  Fatigue noted, especially in calf musculature. Good tolerance to strength and plyometric challenges, cues offered but pt able to pick up on variations.    PT Treatment/Interventions  ADLs/Self Care Home Management;Cryotherapy;Electrical Stimulation;Ultrasound;Moist Heat;Iontophoresis 71m/ml Dexamethasone;Gait training;Stair training;Functional mobility training;Therapeutic activities;Therapeutic exercise;Balance training;Patient/family education;Neuromuscular re-education;Manual techniques;Passive range of motion;Scar mobilization;Dry needling;Taping    PT Next Visit Plan  flx >90 OK but no loading over 90 (6 more weeks after MD on 9/25)    [place]herry Hills Villagewhen able, how is HEP? how is gym?    PT Home Exercise Plan  supine HSS & ITB, long sitting gastroc stretch, supine hip flx+abd, sidelying hip abd mini squats , TKE squats, eccentric heel raise, single leg running balance, heel raises turn out, mini pistol squat, standing hip abd/ER ball behind knee; bosu exercises, primal push ups, double leg hops    Consulted and Agree with Plan of Care  Patient  Patient will benefit from skilled therapeutic intervention in order to improve the following deficits and impairments:  Decreased range of motion, Difficulty walking,  Increased muscle spasms, Decreased activity tolerance, Pain, Improper body mechanics, Impaired flexibility, Decreased balance, Decreased strength, Postural dysfunction  Visit Diagnosis: Acute pain of left knee  Muscle weakness (generalized)     Problem List Patient Active Problem List   Diagnosis Date Noted  . 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 Blima Jaimes PT, DPT 12/14/18 8:51 AM   [facility] [address] [place], [place], [address] Phone: [phone]   Fax:  [phone]  Name: Luis Arias MRN: [id number] Date of Birth: May 24, 1990

## 2018-12-21 ENCOUNTER — Ambulatory Visit: Payer: No Typology Code available for payment source | Admitting: Physical Therapy

## 2018-12-21 ENCOUNTER — Encounter: Payer: Self-pay | Admitting: Physical Therapy

## 2018-12-21 ENCOUNTER — Other Ambulatory Visit: Payer: Self-pay

## 2018-12-21 DIAGNOSIS — M6281 Muscle weakness (generalized): Secondary | ICD-10-CM

## 2018-12-21 DIAGNOSIS — M25562 Pain in left knee: Secondary | ICD-10-CM | POA: Diagnosis not present

## 2018-12-21 NOTE — Therapy (Signed)
[place], [place], [address] Phone: [phone]   Fax:  [phone]  Physical Therapy Treatment  Patient Details  Name: Luis Arias MRN: [id number] Date of Birth: 07-26-1990 Referring Provider (PT): Marlou Sa Tonna Corner, MD   Encounter Date: 12/21/2018  PT End of Session - 12/21/18 1024    Visit Number  20    Number of Visits  31    Date for PT Re-Evaluation  01/26/19    Authorization Type  VA    Authorization Time Period  Eval + 15 visits, 10/2 15 more visits 9/24-1/23    Authorization - Visit Number  5    Authorization - Number of Visits  15    PT Start Time  1020    PT Stop Time  1058    PT Time Calculation (min)  38 min    Activity Tolerance  Patient tolerated treatment well    Behavior During Therapy  [facility] for tasks assessed/performed       Past Medical History:  Diagnosis Date  . Anxiety   . Arthritis    knees  . Depression   . GERD (gastroesophageal reflux disease)    controls with diet  . PTSD (post-traumatic stress disorder)    "sees a specialist at [facility]    Past Surgical History:  Procedure Laterality Date  . HIP ARTHROSCOPY W/ LABRAL REPAIR Left   . KNEE ARTHROSCOPY Left 08/2015  . KNEE ARTHROSCOPY Right 10/2008  . KNEE ARTHROSCOPY WITH MENISCAL REPAIR Left 08/30/2018   Procedure: LEFT KNEE ARTHROSCOPY AND DEBRIEDMENT AND REPAIR;  Surgeon: Meredith Pel, MD;  Location: [place];  Service: Orthopedics;  Laterality: Left;    There were no vitals filed for this visit.  Subjective Assessment - 12/21/18 1022    Subjective  Everything seems to be evening out. pain from shin moved to foot a little but I was able to do soft tissue to get it out. Able to sit with legs crossed without hip or knee impingement. I doubt myself in some motions- getting ready to get up, jumping & landing.    Patient Stated Goals  Trying to get strength and flexibility back.  To get to the point to  where I can participate in sports.    Currently in Pain?  No/denies                       [facility] Adult PT Treatment/Exercise - 12/21/18 0001      Knee/Hip Exercises: Stretches   Passive Hamstring Stretch Limitations  supine with strap- midline & lateral      Knee/Hip Exercises: Plyometrics   Other Plyometric Exercises  SL box jumps, sumo squat hops    Other Plyometric Exercises  SL long jumps      Knee/Hip Exercises: Standing   Functional Squat Limitations  lunge- opp foot on slider, arcs around    Other Standing Knee Exercises  pistol squats      Knee/Hip Exercises: Prone   Other Prone Exercises  bird dog- knee to elbow               PT Short Term Goals - 12/07/18 1133      PT SHORT TERM GOAL #1   Title  gross hip strenght 5/5    Baseline  see flowsheet    Time  4    Period  Weeks    Status  On-going      PT SHORT  TERM GOAL #2   Title  .    Baseline  --    Time  --    Period  --    Status  --    Target Date  --      PT SHORT TERM GOAL #3   Title  --    Baseline  --    Time  --    Period  --    Target Date  --        PT Long Term Goals - 12/07/18 1134      PT LONG TERM GOAL #1   Title  Pt will be able to return to gym workouts    Baseline  going to gym but not at 100% currently    Time  6    Period  Weeks    Status  On-going      PT LONG TERM GOAL #2   Title  Pt will be independent in long term HEP for continued care    Baseline  requires further progression    Time  6    Period  Weeks    Status  On-going      PT LONG TERM GOAL #3   Title  Pt will demo proper balance statically and dynamically in single leg stance to indicate biomechanical chain control    Baseline  poor balance and control in SLS    Period  Weeks    Status  On-going      PT LONG TERM GOAL #4   Title  Pt will demonstrate improved hip extensor strength to at least 4/5 for decreased Trendelenburg gait pattern, improved stability with gait.    Period  Weeks     Status  Achieved      PT LONG TERM GOAL #5   Title  plyometrics            Plan - 12/21/18 1046    Clinical Impression Statement  Continued to challenge dynamic exercises requiring strength and balance to improve confidence. Fatigue noted without pain.    PT Treatment/Interventions  ADLs/Self Care Home Management;Cryotherapy;Electrical Stimulation;Ultrasound;Moist Heat;Iontophoresis 83m/ml Dexamethasone;Gait training;Stair training;Functional mobility training;Therapeutic activities;Therapeutic exercise;Balance training;Patient/family education;Neuromuscular re-education;Manual techniques;Passive range of motion;Scar mobilization;Dry needling;Taping    PT Next Visit Plan  flx >90 OK but no loading over 90 (6 more weeks after MD on 9/25)    Tai Chi when able    PT Home Exercise Plan  supine HSS & ITB, long sitting gastroc stretch, supine hip flx+abd, sidelying hip abd mini squats , TKE squats, eccentric heel raise, single leg running balance, heel raises turn out, mini pistol squat, standing hip abd/ER ball behind knee; bosu exercises, primal push ups, double leg hops    Consulted and Agree with Plan of Care  Patient       Patient will benefit from skilled therapeutic intervention in order to improve the following deficits and impairments:  Decreased range of motion, Difficulty walking, Increased muscle spasms, Decreased activity tolerance, Pain, Improper body mechanics, Impaired flexibility, Decreased balance, Decreased strength, Postural dysfunction  Visit Diagnosis: Acute pain of left knee  Muscle weakness (generalized)     Problem List Patient Active Problem List   Diagnosis Date Noted  . 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 Chanae Gemma PT, DPT 12/21/18 10:59 AM   [facility]St[facility] [address]Phone: [phone]  Fax:  [phone] Name: KIsaih  Cart MRN:  [id number] Date of Birth: February 14, 1991

## 2018-12-27 ENCOUNTER — Ambulatory Visit: Payer: No Typology Code available for payment source | Admitting: Orthopedic Surgery

## 2018-12-28 ENCOUNTER — Other Ambulatory Visit: Payer: Self-pay

## 2018-12-28 ENCOUNTER — Encounter: Payer: Self-pay | Admitting: Physical Therapy

## 2018-12-28 ENCOUNTER — Ambulatory Visit: Payer: No Typology Code available for payment source | Attending: Orthopedic Surgery | Admitting: Physical Therapy

## 2018-12-28 DIAGNOSIS — M25562 Pain in left knee: Secondary | ICD-10-CM | POA: Diagnosis not present

## 2018-12-28 DIAGNOSIS — R2689 Other abnormalities of gait and mobility: Secondary | ICD-10-CM | POA: Diagnosis present

## 2018-12-28 DIAGNOSIS — M25652 Stiffness of left hip, not elsewhere classified: Secondary | ICD-10-CM | POA: Insufficient documentation

## 2018-12-28 DIAGNOSIS — M6281 Muscle weakness (generalized): Secondary | ICD-10-CM | POA: Insufficient documentation

## 2018-12-28 DIAGNOSIS — M25552 Pain in left hip: Secondary | ICD-10-CM | POA: Diagnosis present

## 2018-12-28 NOTE — Therapy (Signed)
[place], [place], [address] Phone: [phone]   Fax:  [phone]  Physical Therapy Treatment  Patient Details  Name: Luis Arias MRN: [id number] Date of Birth: [birth year] Referring Provider (PT): Marlou Sa Tonna Corner, MD   Encounter Date: 12/28/2018  PT End of Session - 12/28/18 0815    Visit Number  21    Number of Visits  31    Date for PT Re-Evaluation  01/26/19    Authorization Type  VA    Authorization Time Period  Eval + 15 visits, 10/2 15 more visits 9/24-1/23    Authorization - Visit Number  6    Authorization - Number of Visits  15    PT Start Time  0815   pt arrived late   PT Stop Time  0847    PT Time Calculation (min)  32 min    Activity Tolerance  Patient tolerated treatment well    Behavior During Therapy  [facility] for tasks assessed/performed       Past Medical History:  Diagnosis Date  . Anxiety   . Arthritis    knees  . Depression   . GERD (gastroesophageal reflux disease)    controls with diet  . PTSD (post-traumatic stress disorder)    "sees a specialist at [facility]    Past Surgical History:  Procedure Laterality Date  . HIP ARTHROSCOPY W/ LABRAL REPAIR Left   . KNEE ARTHROSCOPY Left 08/2015  . KNEE ARTHROSCOPY Right 10/2008  . KNEE ARTHROSCOPY WITH MENISCAL REPAIR Left 08/30/2018   Procedure: LEFT KNEE ARTHROSCOPY AND DEBRIEDMENT AND REPAIR;  Surgeon: Meredith Pel, MD;  Location: [place];  Service: Orthopedics;  Laterality: Left;    There were no vitals filed for this visit.  Subjective Assessment - 12/28/18 0816    Subjective  Calf is still tight. I have been doing plyometrics and balancing.    Patient Stated Goals  Trying to get strength and flexibility back.  To get to the point to where I can participate in sports.    Currently in Pain?  No/denies                       [facility] Adult PT Treatment/Exercise - 12/28/18 0001      Knee/Hip  Exercises: Stretches   Passive Hamstring Stretch Limitations  pike position      Knee/Hip Exercises: Aerobic   Elliptical  5 min L10 ramp 10      Knee/Hip Exercises: Prone   Other Prone Exercises  plank- alt lifts, long arc abd, HS press, reverse planks with SLR      Manual Therapy   Soft tissue mobilization  IASTM peroneals & lateral gastroc               PT Short Term Goals - 12/07/18 1133      PT SHORT TERM GOAL #1   Title  gross hip strenght 5/5    Baseline  see flowsheet    Time  4    Period  Weeks    Status  On-going      PT SHORT TERM GOAL #2   Title  .    Baseline  --    Time  --    Period  --    Status  --    Target Date  --      PT SHORT TERM GOAL #3   Title  --    Baseline  --  Time  --    Period  --    Target Date  --        PT Long Term Goals - 12/07/18 1134      PT LONG TERM GOAL #1   Title  Pt will be able to return to gym workouts    Baseline  going to gym but not at 100% currently    Time  6    Period  Weeks    Status  On-going      PT LONG TERM GOAL #2   Title  Pt will be independent in long term HEP for continued care    Baseline  requires further progression    Time  6    Period  Weeks    Status  On-going      PT LONG TERM GOAL #3   Title  Pt will demo proper balance statically and dynamically in single leg stance to indicate biomechanical chain control    Baseline  poor balance and control in SLS    Period  Weeks    Status  On-going      PT LONG TERM GOAL #4   Title  Pt will demonstrate improved hip extensor strength to at least 4/5 for decreased Trendelenburg gait pattern, improved stability with gait.    Period  Weeks    Status  Achieved      PT LONG TERM GOAL #5   Title  plyometrics            Plan - 12/28/18 1046    Clinical Impression Statement  planks utilized for core strength and for a different challenge to balance. tightness in gastroc released with STM.    PT Treatment/Interventions  ADLs/Self  Care Home Management;Cryotherapy;Electrical Stimulation;Ultrasound;Moist Heat;Iontophoresis 19m/ml Dexamethasone;Gait training;Stair training;Functional mobility training;Therapeutic activities;Therapeutic exercise;Balance training;Patient/family education;Neuromuscular re-education;Manual techniques;Passive range of motion;Scar mobilization;Dry needling;Taping    PT Next Visit Plan  flx >90 OK but no loading over 90 (6 more weeks after MD on 9/25)    Tai Chi when able    PT Home Exercise Plan  supine HSS & ITB, long sitting gastroc stretch, supine hip flx+abd, sidelying hip abd mini squats , TKE squats, eccentric heel raise, single leg running balance, heel raises turn out, mini pistol squat, standing hip abd/ER ball behind knee; bosu exercises, primal push ups, double leg hops    Consulted and Agree with Plan of Care  Patient       Patient will benefit from skilled therapeutic intervention in order to improve the following deficits and impairments:  Decreased range of motion, Difficulty walking, Increased muscle spasms, Decreased activity tolerance, Pain, Improper body mechanics, Impaired flexibility, Decreased balance, Decreased strength, Postural dysfunction  Visit Diagnosis: Acute pain of left knee  Muscle weakness (generalized)  Stiffness of left hip, not elsewhere classified  Pain in left hip  Other abnormalities of gait and mobility     Problem List Patient Active Problem List   Diagnosis Date Noted  . 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

## 2019-01-04 ENCOUNTER — Other Ambulatory Visit: Payer: Self-pay

## 2019-01-04 ENCOUNTER — Ambulatory Visit: Payer: No Typology Code available for payment source

## 2019-01-04 DIAGNOSIS — R2689 Other abnormalities of gait and mobility: Secondary | ICD-10-CM

## 2019-01-04 DIAGNOSIS — M25562 Pain in left knee: Secondary | ICD-10-CM | POA: Diagnosis not present

## 2019-01-04 DIAGNOSIS — M6281 Muscle weakness (generalized): Secondary | ICD-10-CM

## 2019-01-04 NOTE — Therapy (Signed)
[place], [place], [address] Phone: [phone]   Fax:  [phone]  Physical Therapy Treatment  Patient Details  Name: Tavius Turgeon MRN: [id number] Date of Birth: [birth year] Referring Provider (PT): Marlou Sa Tonna Corner, MD   Encounter Date: 01/04/2019  PT End of Session - 01/04/19 0759    Visit Number  22    Number of Visits  31    Date for PT Re-Evaluation  01/26/19    Authorization Type  VA    Authorization Time Period  Eval + 15 visits, 10/2 15 more visits 9/24-1/23    PT Start Time  0758   late for 745 appointment   PT Stop Time  0828    PT Time Calculation (min)  30 min    Activity Tolerance  Patient tolerated treatment well    Behavior During Therapy  [facility] for tasks assessed/performed       Past Medical History:  Diagnosis Date  . Anxiety   . Arthritis    knees  . Depression   . GERD (gastroesophageal reflux disease)    controls with diet  . PTSD (post-traumatic stress disorder)    "sees a specialist at [facility]    Past Surgical History:  Procedure Laterality Date  . HIP ARTHROSCOPY W/ LABRAL REPAIR Left   . KNEE ARTHROSCOPY Left 08/2015  . KNEE ARTHROSCOPY Right 10/2008  . KNEE ARTHROSCOPY WITH MENISCAL REPAIR Left 08/30/2018   Procedure: LEFT KNEE ARTHROSCOPY AND DEBRIEDMENT AND REPAIR;  Surgeon: Meredith Pel, MD;  Location: [place];  Service: Orthopedics;  Laterality: Left;    There were no vitals filed for this visit.  Subjective Assessment - 01/04/19 0757    Subjective  He reports LT knee  Sx and now to see me for [place]. No pain just stiff now.,       He reports balance issue  with jumping uses RT side more.    Currently in Pain?  No/denies                       [facility] Adult PT Treatment/Exercise - 01/04/19 0001      Neuro Re-ed    Neuro Re-ed Details   Tai chi for Oa and fall prevention to star tfor balance issues per pt.               PT Education - 01/04/19 0859    Education Details  Tai chi for arthritis sequence  from Rose Phi MD first 6 forms   He was given info for online instruction from you tube video    Person(s) Educated  Patient    Methods  Explanation;Demonstration;Tactile cues;Verbal cues    Comprehension  Returned demonstration;Verbalized understanding       PT Short Term Goals - 12/07/18 1133      PT SHORT TERM GOAL #1   Title  gross hip strenght 5/5    Baseline  see flowsheet    Time  4    Period  Weeks    Status  On-going      PT SHORT TERM GOAL #2   Title  .    Baseline  --    Time  --    Period  --    Status  --    Target Date  --      PT SHORT TERM GOAL #3   Title  --    Baseline  --  Time  --    Period  --    Target Date  --        PT Long Term Goals - 12/07/18 1134      PT LONG TERM GOAL #1   Title  Pt will be able to return to gym workouts    Baseline  going to gym but not at 100% currently    Time  6    Period  Weeks    Status  On-going      PT LONG TERM GOAL #2   Title  Pt will be independent in long term HEP for continued care    Baseline  requires further progression    Time  6    Period  Weeks    Status  On-going      PT LONG TERM GOAL #3   Title  Pt will demo proper balance statically and dynamically in single leg stance to indicate biomechanical chain control    Baseline  poor balance and control in SLS    Period  Weeks    Status  On-going      PT LONG TERM GOAL #4   Title  Pt will demonstrate improved hip extensor strength to at least 4/5 for decreased Trendelenburg gait pattern, improved stability with gait.    Period  Weeks    Status  Achieved      PT LONG TERM GOAL #5   Title  plyometrics            Plan - 01/04/19 0800    Clinical Impression Statement  Continue with tai chi for OA and fall prevention    PT Treatment/Interventions  ADLs/Self Care Home Management;Cryotherapy;Electrical Stimulation;Ultrasound;Moist  Heat;Iontophoresis 39m/ml Dexamethasone;Gait training;Stair training;Functional mobility training;Therapeutic activities;Therapeutic exercise;Balance training;Patient/family education;Neuromuscular re-education;Manual techniques;Passive range of motion;Scar mobilization;Dry needling;Taping    PT Home Exercise Plan  supine HSS & ITB, long sitting gastroc stretch, supine hip flx+abd, sidelying hip abd mini squats , TKE squats, eccentric heel raise, single leg running balance, heel raises turn out, mini pistol squat, standing hip abd/ER ball behind knee; bosu exercises, primal push ups, double leg hops    Consulted and Agree with Plan of Care  Patient       Patient will benefit from skilled therapeutic intervention in order to improve the following deficits and impairments:  Decreased range of motion, Difficulty walking, Increased muscle spasms, Decreased activity tolerance, Pain, Improper body mechanics, Impaired flexibility, Decreased balance, Decreased strength, Postural dysfunction  Visit Diagnosis: Muscle weakness (generalized)  Other abnormalities of gait and mobility     Problem List Patient Active Problem List   Diagnosis Date Noted  . 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

## 2019-01-10 ENCOUNTER — Ambulatory Visit (INDEPENDENT_AMBULATORY_CARE_PROVIDER_SITE_OTHER): Payer: No Typology Code available for payment source | Admitting: Orthopedic Surgery

## 2019-01-10 ENCOUNTER — Other Ambulatory Visit: Payer: Self-pay

## 2019-01-10 DIAGNOSIS — M25561 Pain in right knee: Secondary | ICD-10-CM

## 2019-01-10 DIAGNOSIS — M25562 Pain in left knee: Secondary | ICD-10-CM | POA: Diagnosis not present

## 2019-01-11 ENCOUNTER — Ambulatory Visit: Payer: No Typology Code available for payment source

## 2019-01-11 DIAGNOSIS — R2689 Other abnormalities of gait and mobility: Secondary | ICD-10-CM

## 2019-01-11 DIAGNOSIS — M6281 Muscle weakness (generalized): Secondary | ICD-10-CM

## 2019-01-11 DIAGNOSIS — M25562 Pain in left knee: Secondary | ICD-10-CM | POA: Diagnosis not present

## 2019-01-11 NOTE — Therapy (Addendum)
[place], [place], [address] Phone: [phone]   Fax:  [phone]  Physical Therapy Treatment/Discharge  Patient Details  Name: Jovante Hammitt MRN: [id number] Date of Birth: January 08, 1991 Referring Provider (PT): Marlou Sa Tonna Corner, MD   Encounter Date: 01/11/2019  PT End of Session - 01/11/19 0755    Visit Number  23    Number of Visits  31    Date for PT Re-Evaluation  01/26/19    Authorization Type  VA    Authorization Time Period  Eval + 15 visits, 10/2 15 more visits 9/24-1/23    Authorization - Visit Number  7    Authorization - Number of Visits  15    PT Start Time  7939   late   PT Stop Time  0830    PT Time Calculation (min)  35 min    Activity Tolerance  Patient tolerated treatment well    Behavior During Therapy  [facility] for tasks assessed/performed       Past Medical History:  Diagnosis Date  . Anxiety   . Arthritis    knees  . Depression   . GERD (gastroesophageal reflux disease)    controls with diet  . PTSD (post-traumatic stress disorder)    "sees a specialist at [facility]    Past Surgical History:  Procedure Laterality Date  . HIP ARTHROSCOPY W/ LABRAL REPAIR Left   . KNEE ARTHROSCOPY Left 08/2015  . KNEE ARTHROSCOPY Right 10/2008  . KNEE ARTHROSCOPY WITH MENISCAL REPAIR Left 08/30/2018   Procedure: LEFT KNEE ARTHROSCOPY AND DEBRIEDMENT AND REPAIR;  Surgeon: Meredith Pel, MD;  Location: [place];  Service: Orthopedics;  Laterality: Left;    There were no vitals filed for this visit.  Subjective Assessment - 01/11/19 0757    Subjective  RT knee pain started  and swelling began. Difficult weeks LT knee good RT knee has pain.    Pertinent History  knee surgery R meniscal tear 2016; L knee meniscal tear surgery July 2017; L hip acetabular tear s/p arthroscopic surgery 12/23/17    Pain Score  6     Pain Location  Knee    Pain Orientation  Right    Pain Descriptors /  Indicators  Sore    Pain Type  Acute pain    Pain Onset  In the past 7 days    Pain Frequency  Constant                       [facility] Adult PT Treatment/Exercise - 01/11/19 0001      Neuro Re-ed    Neuro Re-ed Details   Tai chi for Oa and fall prevention to star tfor balance issues per pt.              PT Education - 01/11/19 0919    Education Details  tai chi reveiews adn sugested he slow down and breath normal.    Person(s) Educated  Patient    Methods  Explanation    Comprehension  Verbalized understanding       PT Short Term Goals - 12/07/18 1133      PT SHORT TERM GOAL #1   Title  gross hip strenght 5/5    Baseline  see flowsheet    Time  4    Period  Weeks    Status  On-going      PT SHORT TERM GOAL #2   Title  .  Baseline  --    Time  --    Period  --    Status  --    Target Date  --      PT SHORT TERM GOAL #3   Title  --    Baseline  --    Time  --    Period  --    Target Date  --        PT Long Term Goals - 12/07/18 1134      PT LONG TERM GOAL #1   Title  Pt will be able to return to gym workouts    Baseline  going to gym but not at 100% currently    Time  6    Period  Weeks    Status  On-going      PT LONG TERM GOAL #2   Title  Pt will be independent in long term HEP for continued care    Baseline  requires further progression    Time  6    Period  Weeks    Status  On-going      PT LONG TERM GOAL #3   Title  Pt will demo proper balance statically and dynamically in single leg stance to indicate biomechanical chain control    Baseline  poor balance and control in SLS    Period  Weeks    Status  On-going      PT LONG TERM GOAL #4   Title  Pt will demonstrate improved hip extensor strength to at least 4/5 for decreased Trendelenburg gait pattern, improved stability with gait.    Period  Weeks    Status  Achieved      PT LONG TERM GOAL #5   Title  plyometrics            Plan - 01/11/19 0919    Clinical  Impression Statement  He is improved doing the first 2 forms correctly.   Improved all forms post guidance    PT Treatment/Interventions  ADLs/Self Care Home Management;Cryotherapy;Electrical Stimulation;Ultrasound;Moist Heat;Iontophoresis 66m/ml Dexamethasone;Gait training;Stair training;Functional mobility training;Therapeutic activities;Therapeutic exercise;Balance training;Patient/family education;Neuromuscular re-education;Manual techniques;Passive range of motion;Scar mobilization;Dry needling;Taping    PT Next Visit Plan  Assess RT knee pain and progess as able with tai chi and core strength    PT Home Exercise Plan  supine HSS & ITB, long sitting gastroc stretch, supine hip flx+abd, sidelying hip abd mini squats , TKE squats, eccentric heel raise, single leg running balance, heel raises turn out, mini pistol squat, standing hip abd/ER ball behind knee; bosu exercises, primal push ups, double leg hops    Consulted and Agree with Plan of Care  Patient       Patient will benefit from skilled therapeutic intervention in order to improve the following deficits and impairments:  Decreased range of motion, Difficulty walking, Increased muscle spasms, Decreased activity tolerance, Pain, Improper body mechanics, Impaired flexibility, Decreased balance, Decreased strength, Postural dysfunction  Visit Diagnosis: Muscle weakness (generalized)  Other abnormalities of gait and mobility     Problem List Patient Active Problem List   Diagnosis Date Noted  . 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  Patient goals were partially met. Patient is being discharged due to the patient's request.  ?????   Pt cancelled remaining appointments. He will be returning to PCP due to another injury.    Jessica C. Hightower PT, DPT 03/04/19 11:28 AM

## 2019-01-12 ENCOUNTER — Encounter: Payer: Self-pay | Admitting: Orthopedic Surgery

## 2019-01-12 NOTE — Progress Notes (Signed)
Office Visit Note   Patient: Luis Arias           Date of Birth: 11/16/1990           MRN: [id number] Visit Date: 01/10/2019 Requested by: No referring provider defined for this encounter. PCP: Patient, No Pcp Per  Subjective: Chief Complaint  Patient presents with  . Left Knee - Follow-up    HPI: Patient presents with bilateral knee pain.  He is postop now from his left knee meniscal repair.  He is doing well with that.  No mechanical symptoms that he describes.  Still is going to therapy for that left knee to get stronger.  Patient also describes right knee pain.  Had a twisting injury about a week or 2 ago.  Developed swelling.  He does report some mechanical symptoms and lateral joint line pain and tenderness on that right knee.  He did stop smoking 2 months ago.              ROS: All systems reviewed are negative as they relate to the chief complaint within the history of present illness.  Patient denies  fevers or chills.   Assessment & Plan: Visit Diagnoses:  1. Pain in both knees, unspecified chronicity     Plan: Impression is bilateral knee pain left knee is doing well following meniscal repair.  Right knee has an effusion and lateral joint line tenderness.  I recommend MRI scanning of that right knee just so we do not get behind the 8 ball on the right knee as we did a little bit on the left-hand side.  He does have some mechanical symptoms.  Follow-up after that MRI scan but he is can have to try to work that out through the [place].  Follow-Up Instructions: Return if symptoms worsen or fail to improve.   Orders:  No orders of the defined types were placed in this encounter.  No orders of the defined types were placed in this encounter.     Procedures: No procedures performed   Clinical Data: No additional findings.  Objective: Vital Signs: There were no vitals taken for this visit.  Physical Exam:   Constitutional: Patient appears well-developed HEENT:   Head: Normocephalic Eyes:EOM are normal Neck: Normal range of motion Cardiovascular: Normal rate Pulmonary/chest: Effort normal Neurologic: Patient is alert Skin: Skin is warm Psychiatric: Patient has normal mood and affect    Ortho Exam: Ortho exam demonstrates excellent range of motion and strength in the left knee.  No effusion is present.  No joint line tenderness is present.  McMurray compression testing is negative.  On the right knee there is an effusion along with lateral greater than medial joint line tenderness.  Collateral and cruciate ligaments are stable.  Extensor mechanism is intact.  Specialty Comments:  No specialty comments available.  Imaging: No results found.   PMFS History: Patient Active Problem List   Diagnosis Date Noted  . 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 Anxiety   . Arthritis    knees  . Depression   . GERD (gastroesophageal reflux disease)    controls with diet  . PTSD (post-traumatic stress disorder)    "sees a specialist at [facility]    History reviewed. No pertinent family history.  Past Surgical History:  Procedure Laterality Date  . HIP ARTHROSCOPY W/ LABRAL REPAIR Left   . KNEE ARTHROSCOPY Left 08/2015  .  KNEE ARTHROSCOPY Right 10/2008  . KNEE ARTHROSCOPY WITH MENISCAL REPAIR Left 08/30/2018   Procedure: LEFT KNEE ARTHROSCOPY AND DEBRIEDMENT AND REPAIR;  Surgeon: Cammy Copa, MD;  Location: [facility] OR;  Service: Orthopedics;  Laterality: Left;   Social History   Occupational History  . Not on file  Tobacco Use  . Smoking status: Current Some Day Smoker    Years: 12.00    Types: Cigarettes  . Smokeless tobacco: Current User    Types: Chew  . Tobacco comment: < 1 cig a day  Substance and Sexual Activity  . Alcohol use: Not Currently  . Drug use: Not Currently    Frequency: 7.0 times per week    Types: Marijuana  . Sexual activity: Not on file

## 2019-01-18 ENCOUNTER — Ambulatory Visit: Payer: No Typology Code available for payment source

## 2019-02-07 ENCOUNTER — Ambulatory Visit: Payer: No Typology Code available for payment source | Admitting: Orthopedic Surgery

## 2020-09-21 IMAGING — XA FLUORO GUIDED NEEDLE PLACEMENT
2 series · 2 of 2 positions shown · non-contrast
Comparison: none

CLINICAL DATA: Knee pain.

[Series 1: ortho standard · 1 of 1 slices shown (1 of 2)]
[im 1/1]
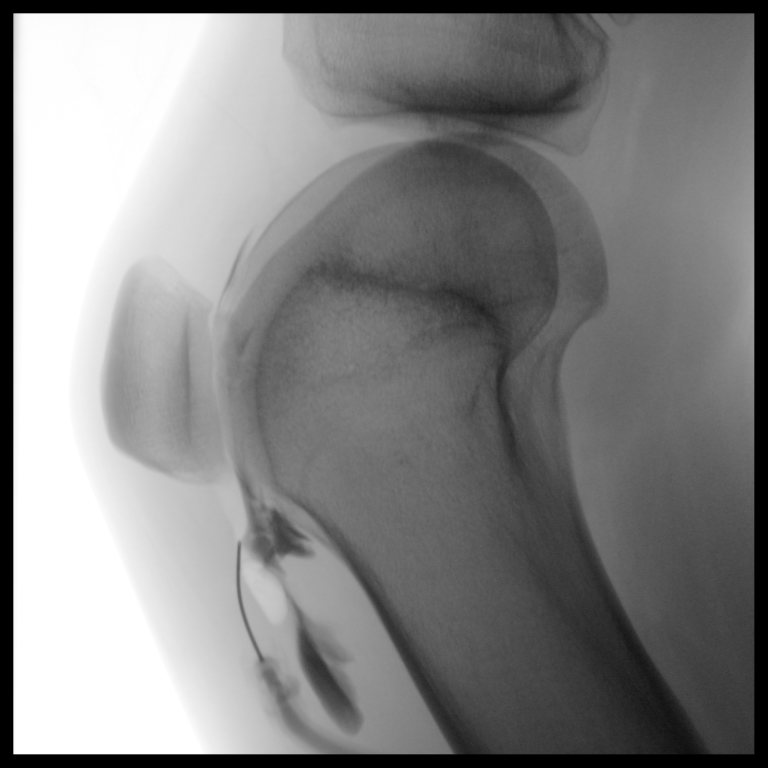

[Series 2: ortho standard · 1 of 1 slices shown (2 of 2)]
[im 1/1]
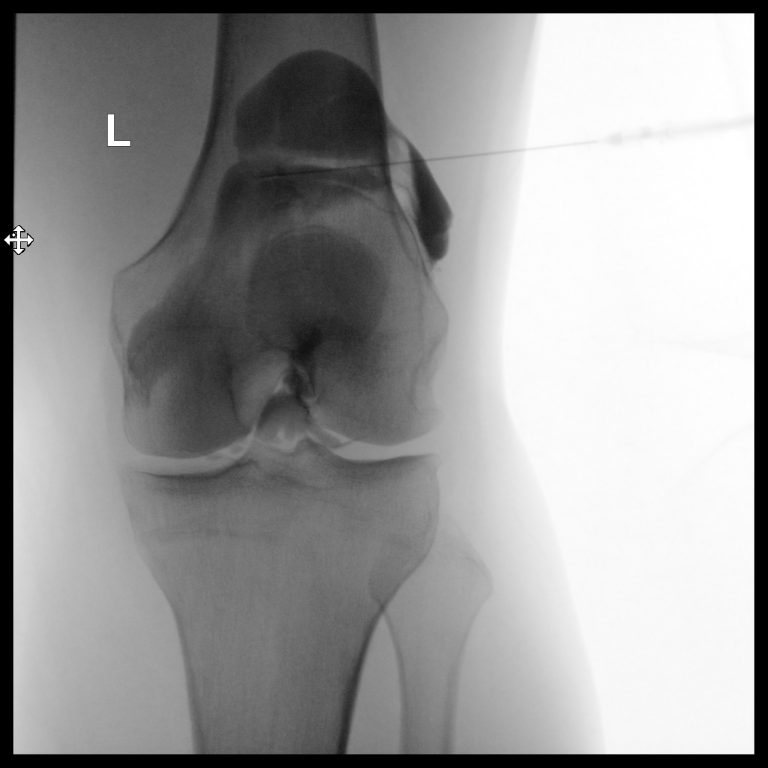

[2 of 2 positions shown; findings below may reference images not displayed]

EXAM:
LEFT KNEE INJECTION FOR MRI

FLUOROSCOPY TIME:  14 seconds corresponding to a Dose Area Product
of 23 Gy*m2

PROCEDURE:
Informed written consent was obtained. Time-out was performed.
Overlying skin prepped with Betadine, draped in the usual sterile
fashion, infiltrated locally with 1% lidocaine. Curved 22 gauge
spinal needle advanced to the superolateral margin of the LEFT
suprapatellar bursa. 1 ml of Lidocaine injected easily. A mixture of
0.2 mL MultiHance in 40 mL of dilute Isovue M 200 was then used to
opacify the LEFT knee joint. 35 mL was injected. No immediate
complication.
IMPRESSION: Technically successful LEFT knee injection under fluoroscopy for MR
arthrogram.

## 2020-09-21 IMAGING — MR MRI OF THE LEFT KNEE WITHOUT CONTRAST
4 of 7 series · 21 of 40 positions shown · non-contrast
Comparison: None.
COMPARISON: None.

Addendum:
CLINICAL DATA: Left knee locks up and catches when he flexes. Pain
for 3 years.

EXAM:
MRI OF THE LEFT KNEE WITHOUT CONTRAST (MR Arthrogram)
TECHNIQUE: Multiplanar, multisequence MR imaging of the knee was performed
following intra-articular contrast injection under fluoroscopic
guidance. No intravenous contrast was administered.

[Series 3: t1_tse_ax fs · axial · 4.0mm · 0.31mm/px · z∈[-64,+61]mm · 4 of 31 slices shown]
[im 1/31]
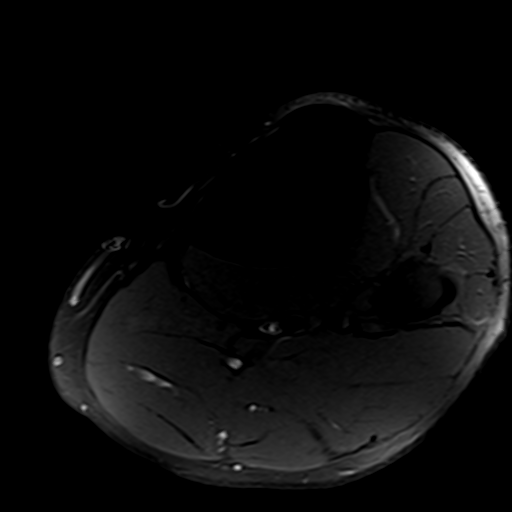
[im 6/31]
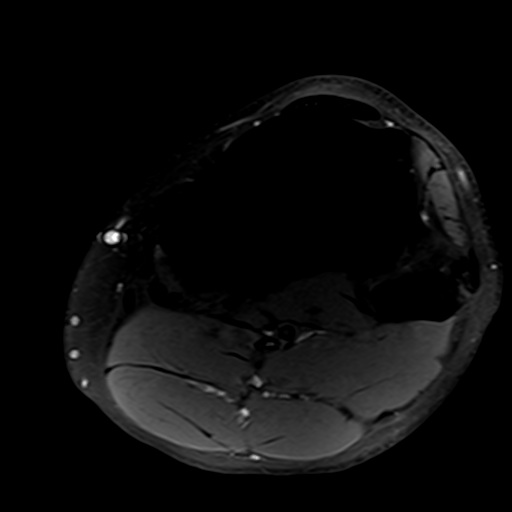
[im 16/31]
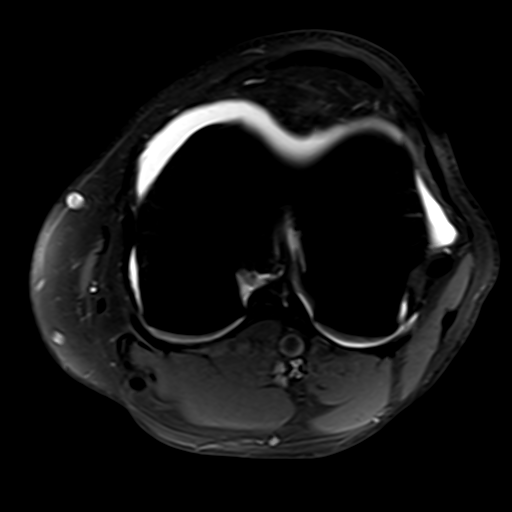
[im 26/31]
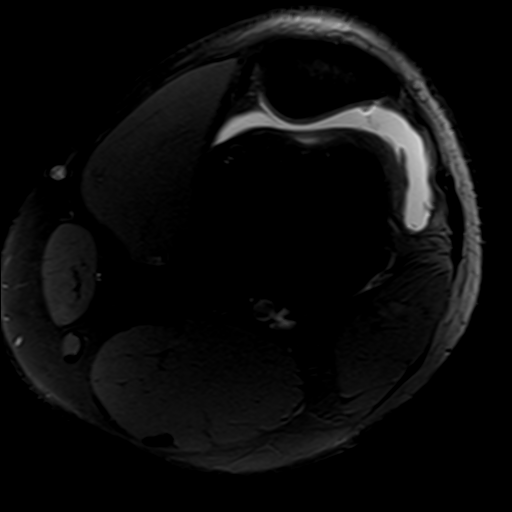

[Series 5: T2 fat-sat · coronal · 4.0mm · 0.66mm/px · 5 of 24 slices shown (1 of 2)]
[im 1/24]
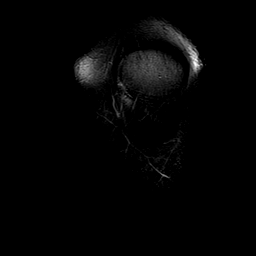
[im 6/24]
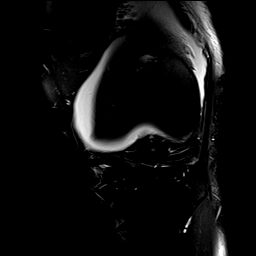
[im 12/24]
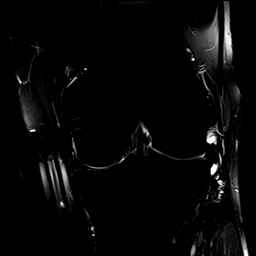
[im 18/24]
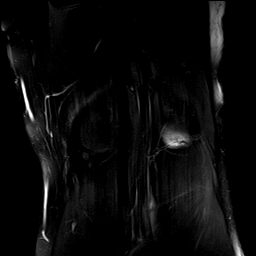
[im 24/24]
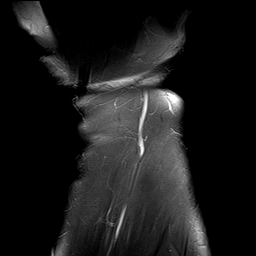

[Series 7: T1 · coronal · 4.0mm · 0.33mm/px · 5 of 24 slices shown]
[im 1/24]
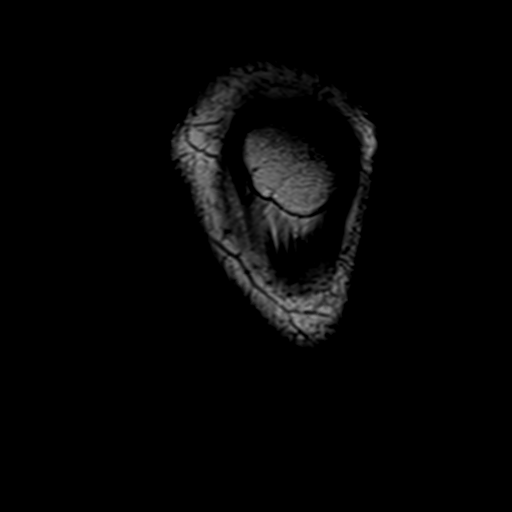
[im 6/24]
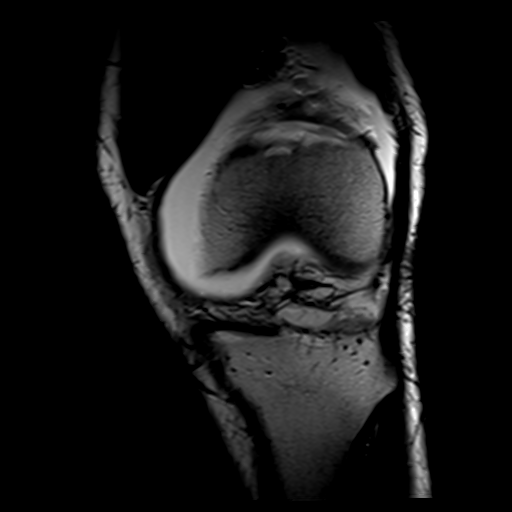
[im 12/24]
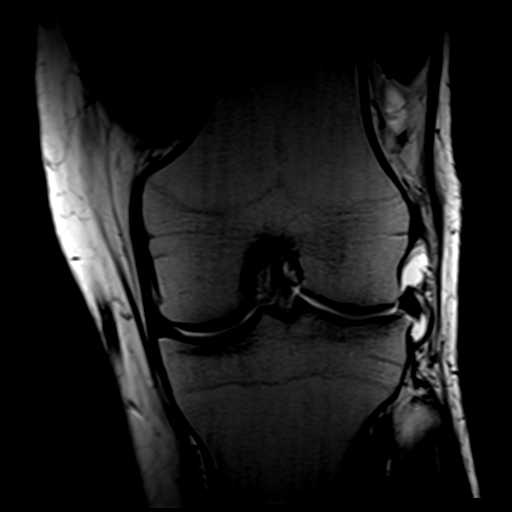
[im 18/24]
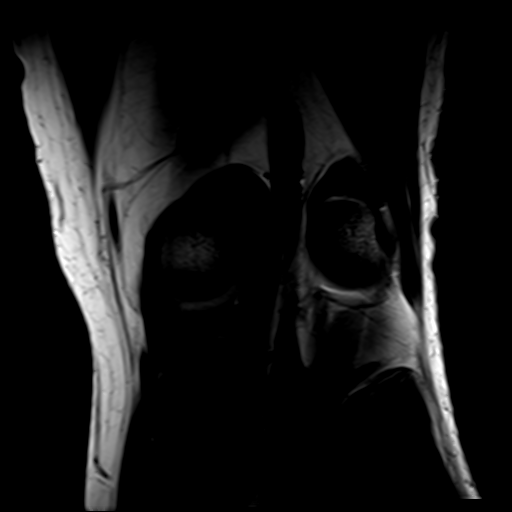
[im 24/24]
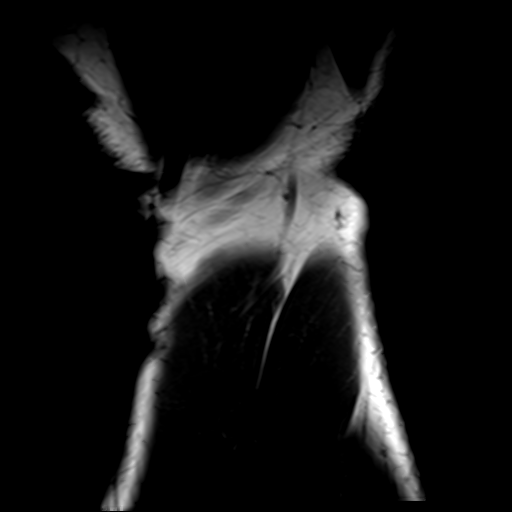

[Series 9: T2 fat-sat · sagittal · 3.0mm · 0.33mm/px · 7 of 30 slices shown (2 of 2)]
[im 1/30]
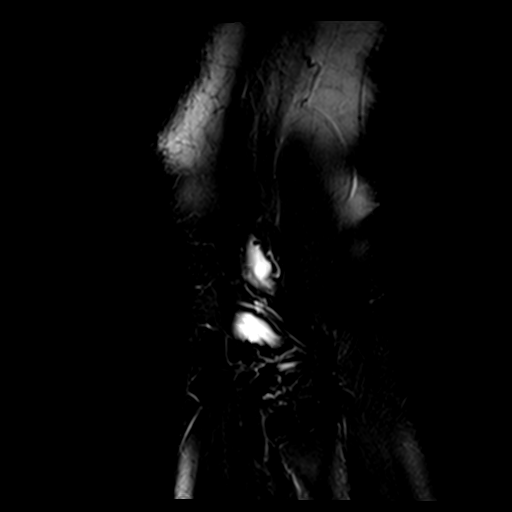
[im 5/30]
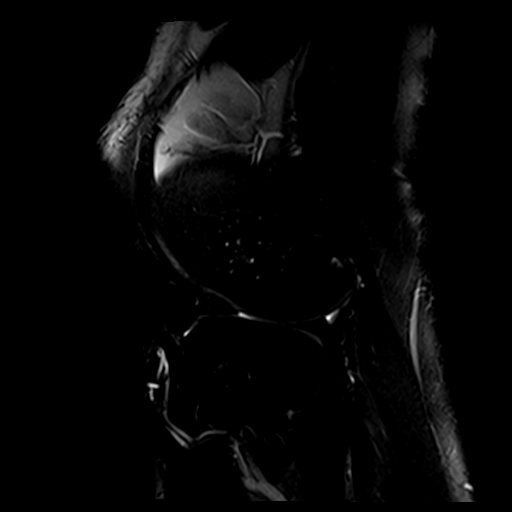
[im 10/30]
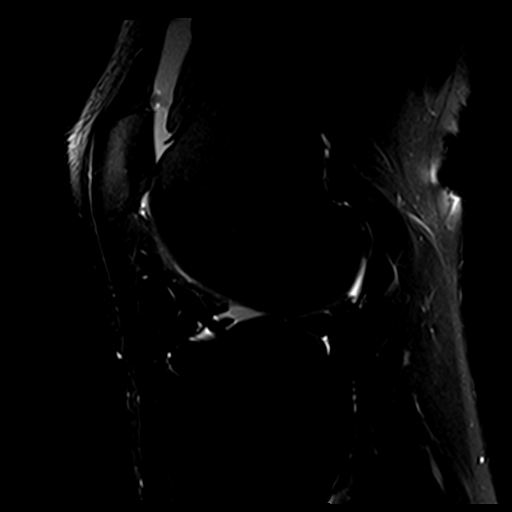
[im 15/30]
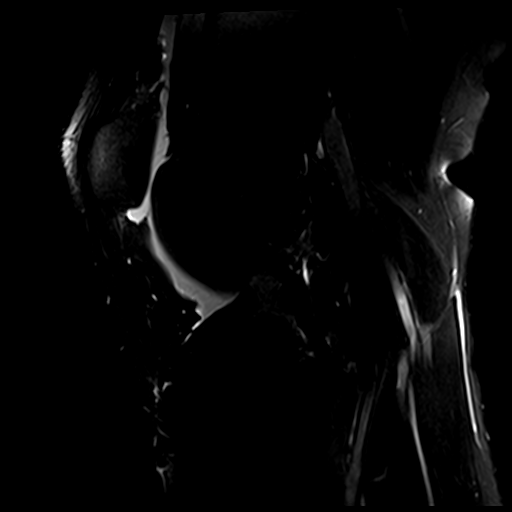
[im 20/30]
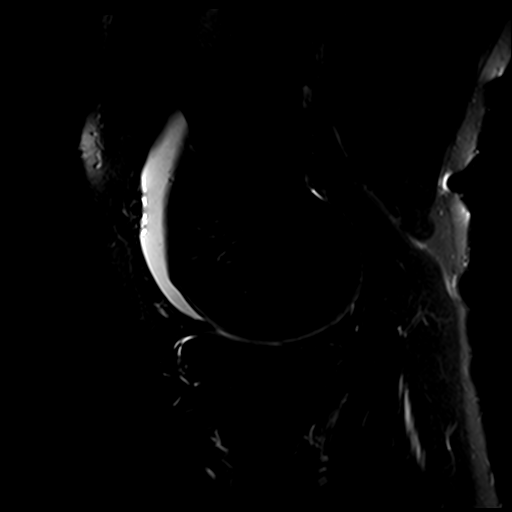
[im 25/30]
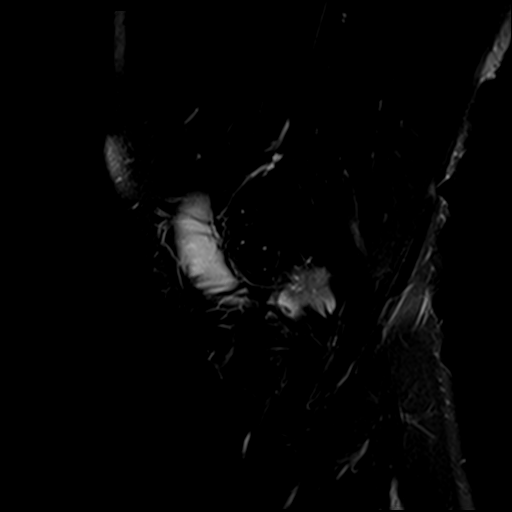
[im 30/30]
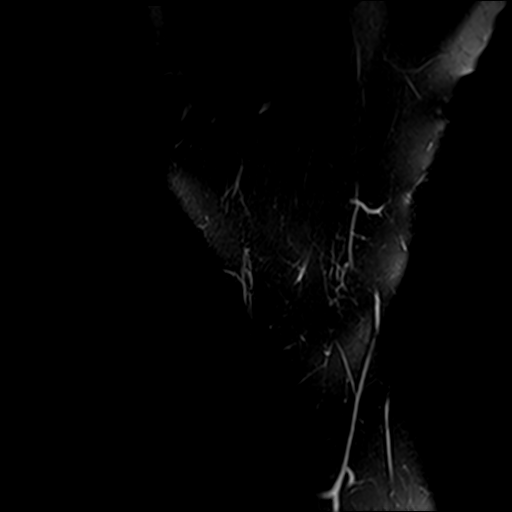

[21 of 40 positions shown; findings below may reference images not displayed]

FINDINGS: MENISCI

Medial meniscus:  Intact.

Lateral meniscus: Tear of the body of the medial meniscus with a
flap of meniscal tissue flipped peripherally and superiorly.

LIGAMENTS

Cruciates:  Intact ACL and PCL.

Collaterals: Medial collateral ligament is intact. Lateral
collateral ligament complex is intact.

CARTILAGE

Patellofemoral:  No chondral defect.

Medial:  No chondral defect.

Lateral: Partial-thickness cartilage loss of the lateral
femorotibial compartment.

Joint: Intraarticular contrast distending the joint capsule. Normal
Hoffa's fat. No plical thickening. Small low signal foci along the
nondependent aspect of the joint consistent with air from the joint
injection.

Popliteal Fossa:  No Baker cyst. Intact popliteus tendon.

Extensor Mechanism: Intact quadriceps tendon. Intact patellar
tendon. Intact medial patellar retinaculum. Intact lateral patellar
retinaculum. Intact MPFL.

Bones:  No acute osseous abnormality.  No aggressive osseous lesion.

Other: No fluid collection or hematoma.
IMPRESSION: 1. Tear of the body of the medial meniscus with a flap of meniscal
tissue flipped peripherally and superiorly.
2. Mild osteoarthritis of lateral femorotibial compartment.
Partial-thickness cartilage loss of the lateral femorotibial
compartment.

ADDENDUM:
Typographical error in the body and impression section of report.

Lateral meniscus: Tear of the body of the LATERAL meniscus with a
flap of meniscal tissue flipped peripherally and superiorly (image
22/series 8). Posterior peripheral vertical linear signal in the
posterior horn of the lateral meniscus in the red zone (image
17-20/series 8) which is not as bright as the contrast in the
remainder of the joint suggesting prior meniscal repair or possibly
chronic tear.

*** End of Addendum ***
FINDINGS: MENISCI

Medial meniscus:  Intact.

Lateral meniscus: Tear of the body of the medial meniscus with a
flap of meniscal tissue flipped peripherally and superiorly.

LIGAMENTS

Cruciates:  Intact ACL and PCL.

Collaterals: Medial collateral ligament is intact. Lateral
collateral ligament complex is intact.

CARTILAGE

Patellofemoral:  No chondral defect.

Medial:  No chondral defect.

Lateral: Partial-thickness cartilage loss of the lateral
femorotibial compartment.

Joint: Intraarticular contrast distending the joint capsule. Normal
Hoffa's fat. No plical thickening. Small low signal foci along the
nondependent aspect of the joint consistent with air from the joint
injection.

Popliteal Fossa:  No Baker cyst. Intact popliteus tendon.

Extensor Mechanism: Intact quadriceps tendon. Intact patellar
tendon. Intact medial patellar retinaculum. Intact lateral patellar
retinaculum. Intact MPFL.

Bones:  No acute osseous abnormality.  No aggressive osseous lesion.

Other: No fluid collection or hematoma.
IMPRESSION: 1. Tear of the body of the medial meniscus with a flap of meniscal
tissue flipped peripherally and superiorly.
2. Mild osteoarthritis of lateral femorotibial compartment.
Partial-thickness cartilage loss of the lateral femorotibial
compartment.

## 2023-07-07 ENCOUNTER — Ambulatory Visit (INDEPENDENT_AMBULATORY_CARE_PROVIDER_SITE_OTHER): Admitting: Orthopaedic Surgery

## 2023-07-07 DIAGNOSIS — M23201 Derangement of unspecified lateral meniscus due to old tear or injury, left knee: Secondary | ICD-10-CM

## 2023-07-16 NOTE — Progress Notes (Signed)
 cancelled

## 2023-07-21 ENCOUNTER — Other Ambulatory Visit (INDEPENDENT_AMBULATORY_CARE_PROVIDER_SITE_OTHER)

## 2023-07-21 ENCOUNTER — Ambulatory Visit (INDEPENDENT_AMBULATORY_CARE_PROVIDER_SITE_OTHER): Admitting: Orthopaedic Surgery

## 2023-07-21 DIAGNOSIS — G8929 Other chronic pain: Secondary | ICD-10-CM | POA: Diagnosis not present

## 2023-07-21 DIAGNOSIS — M25562 Pain in left knee: Secondary | ICD-10-CM | POA: Diagnosis not present

## 2023-07-21 DIAGNOSIS — M23201 Derangement of unspecified lateral meniscus due to old tear or injury, left knee: Secondary | ICD-10-CM | POA: Diagnosis not present

## 2023-07-21 NOTE — Progress Notes (Signed)
 Office Visit Note   Patient: Luis Arias           Date of Birth: August 15, 1990           MRN: [id number] Visit Date: 07/21/2023              Requested by: No referring provider defined for this encounter. PCP: Patient, No Pcp Per   Assessment & Plan: Visit Diagnoses:  1. 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 Chronic pain of left knee     Plan: History of Present Illness Luis Arias is a 34 year old male who presents with recurrent left knee and hip issues. Referred from the [facility] for evaluation of left knee and hip issues.  He experiences a sensation of the left knee being 'out of place' and a snapping sensation along the lateral joint line, particularly when the leg is relaxed and movement is initiated. This area corresponds to the site of a previous meniscus tear repair in 2019. There is no current swelling in the knee.  His left hip, which underwent labrum repair in 2020, does not cause pain but does Arias, contributing to the overall issue when combined with knee movement. Symptoms are exacerbated when both joints are involved in movement.  He manages inflammation with black seed oil and occasionally takes ibuprofen. He also engages in sauna training to help reduce inflammation. Previously, there was significant swelling and fluid in the knee, but this has improved over time.  Physical Exam MUSCULOSKELETAL: Hip rotation does not cause symptoms. Snapping felt in left knee along lateral joint line during movement. No swelling in left knee joint.  Assessment and Plan Knee pain with possible meniscus tear Reproducible knee pain with snapping sensation along the lateral joint line of the left knee. Symptoms suggest lateral meniscus re-tear. High activity level may contribute. - Order MRI of the left knee to assess for possible meniscus re-tear or incomplete healing.  Left knee lateral meniscus tear Status post lateral meniscus tear repair in 2019. Current  symptoms may indicate re-tear   Left hip labrum repair Left hip labrum repair in 2020. Occasional popping without pain. No significant issues indicated, but hip may affect knee biomechanics.  Follow-Up Instructions: No follow-ups on file.   Orders:  Orders Placed This Encounter  Procedures   XR KNEE 3 VIEW LEFT   XR HIP UNILAT W OR W/O PELVIS 2-3 VIEWS LEFT   MR Knee Left w/o contrast   No orders of the defined types were placed in this encounter.    Subjective: Chief Complaint  Patient presents with   Left Hip - Pain   Left Knee - Pain    HPI  Review of Systems  Constitutional: Negative.   HENT: Negative.    Eyes: Negative.   Respiratory: Negative.    Cardiovascular: Negative.   Gastrointestinal: Negative.   Endocrine: Negative.   Genitourinary: Negative.   Skin: Negative.   Allergic/Immunologic: Negative.   Neurological: Negative.   Hematological: Negative.   Psychiatric/Behavioral: Negative.    All other systems reviewed and are negative.    Objective: Vital Signs: There were no vitals taken for this visit.  Physical Exam Vitals and nursing note reviewed.  Constitutional:      Appearance: He is well-developed.  HENT:     Head: Normocephalic and atraumatic.  Eyes:     Pupils: Pupils are equal, round, and reactive to light.  Pulmonary:     Effort: Pulmonary effort is normal.  Abdominal:     Palpations: Abdomen is soft.  Musculoskeletal:        General: Normal range of motion.     Cervical back: Neck supple.  Skin:    General: Skin is warm.  Neurological:     Mental Status: He is alert and oriented to person, place, and time.  Psychiatric:        Behavior: Behavior normal.        Thought Content: Thought content normal.        Judgment: Judgment normal.     Ortho Exam  Specialty Comments:  No specialty comments available.  Imaging: XR HIP UNILAT W OR W/O PELVIS 2-3 VIEWS LEFT Result Date: 07/21/2023 X-rays of the left hip show mild  degenerative changes with early joint space narrowing.  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  Past Surgical History:  Procedure Laterality Date   HIP ARTHROSCOPY W/ LABRAL REPAIR Left    KNEE ARTHROSCOPY Left 08/2015   KNEE ARTHROSCOPY Right 10/2008   KNEE ARTHROSCOPY WITH MENISCAL REPAIR Left 08/30/2018   Procedure: LEFT KNEE ARTHROSCOPY AND DEBRIEDMENT AND REPAIR;  Surgeon: Jasmine Mesi, MD;  Location: [facility] OR;  Service: Orthopedics;  Laterality: Left;   Social History   Occupational History   Not on file  Tobacco Use   Smoking status: Some Days    Types: Cigarettes   Smokeless tobacco: Current    Types: Chew   Tobacco comments:    < 1 cig a day  Vaping Use   Vaping status: Never Used  Substance and Sexual Activity   Alcohol use: Not Currently   Drug use: Not Currently    Frequency: 7.0 times per week    Types: Marijuana   Sexual activity: Not on file

## 2023-08-12 ENCOUNTER — Ambulatory Visit: Admitting: Orthopaedic Surgery

## 2023-12-28 ENCOUNTER — Encounter: Payer: Self-pay | Admitting: Radiology
